# Patient Record
Sex: Female | Born: 1935 | Race: White | Hispanic: No | Marital: Single | Smoking: Never smoker
Health system: Southern US, Community
[De-identification: ages and names within clinical notes are randomized; demographics above are authoritative.]

## PROBLEM LIST (undated history)

## (undated) DIAGNOSIS — R4701 Aphasia: Secondary | ICD-10-CM

## (undated) DIAGNOSIS — F32A Depression, unspecified: Secondary | ICD-10-CM

## (undated) DIAGNOSIS — H409 Unspecified glaucoma: Secondary | ICD-10-CM

## (undated) DIAGNOSIS — I639 Cerebral infarction, unspecified: Secondary | ICD-10-CM

## (undated) DIAGNOSIS — M21371 Foot drop, right foot: Secondary | ICD-10-CM

## (undated) DIAGNOSIS — R1310 Dysphagia, unspecified: Secondary | ICD-10-CM

## (undated) DIAGNOSIS — F329 Major depressive disorder, single episode, unspecified: Secondary | ICD-10-CM

## (undated) DIAGNOSIS — I4891 Unspecified atrial fibrillation: Secondary | ICD-10-CM

## (undated) DIAGNOSIS — R29810 Facial weakness: Secondary | ICD-10-CM

## (undated) DIAGNOSIS — I10 Essential (primary) hypertension: Secondary | ICD-10-CM

---

## 2005-11-09 ENCOUNTER — Inpatient Hospital Stay (HOSPITAL_COMMUNITY): Admission: EM | Admit: 2005-11-09 | Discharge: 2005-11-19 | Payer: Self-pay | Admitting: Emergency Medicine

## 2005-11-09 ENCOUNTER — Ambulatory Visit: Payer: Self-pay | Admitting: Internal Medicine

## 2005-11-09 ENCOUNTER — Ambulatory Visit: Payer: Self-pay | Admitting: Hospitalist

## 2005-11-10 ENCOUNTER — Encounter: Payer: Self-pay | Admitting: Internal Medicine

## 2005-11-10 ENCOUNTER — Encounter: Payer: Self-pay | Admitting: Vascular Surgery

## 2005-11-16 ENCOUNTER — Ambulatory Visit: Payer: Self-pay | Admitting: Physical Medicine & Rehabilitation

## 2005-11-18 ENCOUNTER — Ambulatory Visit: Payer: Self-pay | Admitting: Internal Medicine

## 2006-01-18 ENCOUNTER — Ambulatory Visit (HOSPITAL_COMMUNITY): Admission: RE | Admit: 2006-01-18 | Discharge: 2006-01-18 | Payer: Self-pay | Admitting: Endocrinology

## 2006-02-18 ENCOUNTER — Ambulatory Visit (HOSPITAL_COMMUNITY): Admission: RE | Admit: 2006-02-18 | Discharge: 2006-02-18 | Payer: Self-pay | Admitting: Endocrinology

## 2007-05-12 ENCOUNTER — Emergency Department (HOSPITAL_COMMUNITY): Admission: EM | Admit: 2007-05-12 | Discharge: 2007-05-12 | Payer: Self-pay | Admitting: Emergency Medicine

## 2010-11-20 NOTE — Consult Note (Signed)
NAMETHEREASA, IANNELLO                 ACCOUNT NO.:  [id number]   MEDICAL RECORD NO.:  [id number]          PATIENT TYPE:  INP   LOCATION:  3105                         FACILITY:  [facility]   PHYSICIAN:  Bevelyn Buckles. [place]am[place]y, M.D.DATE OF BIRTH:  1916/07/29   DATE OF CONSULTATION:  11/10/2005  DATE OF DISCHARGE:                                   CONSULTATION   REFERRING PHYSICIAN:  Eliseo Gum, M.D.   REASON FOR CONSULTATION:  Stroke.   HISTORY OF PRESENT ILLNESS:  Ms. Vesey is an 77 year old Caucasian female  with multiple medical problems who presented yesterday with symptoms of  right-sided weakness and aphasia.  The patient was found on the floor at  home by her daughter who noted right-sided weakness and the patient could  not speak.  The patient could say yes or no, however, could not express or  talk any further.  No acute intervention was done secondary the patient was  at a window for IV tPA.  The patient was still aphasic.  MRA showed a left  basal ganglion infarct with hemorrhagic conversion.  The patient still is  aphasic and unable to communicate, so no further history is possible as per  patient.  Chart is reviewed.   PAST MEDICAL HISTORY:  1.  Positive for hypertension.  2.  Hyperlipidemia.  3.  Osteoporosis.  4.  Glaucoma.  5.  Depression.   CURRENT MEDICATIONS:  Lipitor and Protonix.   ALLERGIES:  The patient has no known drug allergies.   FAMILY HISTORY:  Positive for stroke.   SOCIAL HISTORY:  The patient is divorced.  Lives with her son in  [place].  Former smoker.  Does not drink alcohol.   REVIEW OF SYSTEMS:  Unable to assess at this time secondary to aphasia.   PHYSICAL EXAMINATION:  VITAL SIGNS:  Temperature is 98.5, blood pressure is  150/55, pulse is 85, respirations are 20.  O2 saturation is 98% on two  liters.  HEENT:  Normocephalic and atraumatic.  The patient does have a right facial  droop.  Extraocular muscles are intact.  Pupils are equal,  round, and  reactive to light.  The patient is currently in a neck collar.  HEART:  Regular.  LUNGS:  Clear.  ABDOMEN:  Soft and nontender.  EXTREMITIES:  Good pulses.  NEUROLOGICAL:  The patient is awake.  Follows some simple commands.  Cranial  nerves:  The patient has a right facial droop.  Extraocular muscles are  intact.  Pupils are equal, round, and reactive to light.  Tongue is midline.  Motor examination:  The patient has right hemiparesis, worse in the upper  extremities than lower extremities.  The patient has 0 to 1/5 on the right  upper extremity and 2 to 3/5 in the right lower extremity.  The patient has  decreased tone in the right upper extremity and right pronator drift all the  way to bed.  Sensory examination:  The patient has decreased withdrawal on  the right when compared to the left.  Reflexes are trace throughout.  Right  toes upgoing.  Left toes are downgoing.  Cerebellar function and gait are  unable to assess at this time.   MRI of the brain show left basal ganglion, hemorrhagic infarct with mass-  effect on the lateral ventricle.  2-D echocardiogram showed an EF of 60-65%.   LABS:  WBC is 5.8, hemoglobin is 12.7, hematocrit is 37.4, platelets  173,000.  PT is 13.1, INR is 1.0, PTT is 26.  Sodium is 138, potassium is  3.2, chloride is 107, CO2 is 26, BUN 5, creatinine 0.6, glucose 141, calcium  is 8.3, cholesterol is 157, LDL is 82, HDL is 64.   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  The patient's blood pressure needs to be optimally control  and I agree withholding aspirin and Aggrenox.  I will continue the stroke  workup and I would add a homocystine level at this time.  If the homocystine  level is elevated, I would consider starting Foltx and Metanx 1 tablet  daily. I would repeat her CT scan in the morning to see if there is any   progression of the bleed.  The patient needs to continue having frequent  neuro checks and monitoring closely.  Get PT and OT consults and have a  cervical spine evaluated as MRA of the brain showed a questionable edema  around the dens.  I will keep the patient n.p.o. until she clears swallowing  evaluation.  We will follow the patient on the stroke consult service.      Bevelyn Buckles. Nash Shearer, M.D.  Electronically Signed     DRC/MEDQ  D:  11/10/2005  T:  11/12/2005  Job:  [id number]

## 2010-11-20 NOTE — Consult Note (Signed)
Amber Zamora, Amber Zamora                 ACCOUNT NO.:  [id number]   MEDICAL RECORD NO.:  [id number]          PATIENT TYPE:  INP   LOCATION:  3011                         FACILITY:  [facility]   PHYSICIAN:  Iva Boop, M.D. LHCDATE OF BIRTH:  14-Dec-1916   DATE OF CONSULTATION:  11/15/2005  DATE OF DISCHARGE:                                   CONSULTATION   REQUESTING PHYSICIAN:  Dr. Eliseo Gum.   REASON FOR CONSULTATION:  Dysphagia after a stroke.   ASSESSMENT:  Neurogenic dysphagia.  The patient has had a large MCA stroke  on the left and has a oropharyngeal dysphagia related to that.  She has  failed two modified barium swallows and aspirated.   PLAN:  She is an appropriate candidate for percutaneous endoscopic  gastrostomy tube.  We will arrange to do this tomorrow.  We have spoken to  the family and explained this, and they agreed to proceed.   HISTORY:  An 77 year old woman admitted with new-onset weakness turned out  to have a large left middle cerebral artery distribution stroke, thought to  be hemorrhagic.  She has aphasia and a right hemiparesis as well as  oropharyngeal dysphagia documented on two swallowing studies.  She really  cannot provide any history.  She has a nasoenteric tube in with Jevity  infusing at 45 mL/hour at this time.  The plans are for her to go to an  extended care facility for whatever rehab she can tolerate.   MEDICATIONS:  1.  Protonix 40 mg daily.  2.  Chlorhexidine rinse.  3.  Jevity supplements.  4.  Diltiazem infusion has been used at times.   No known drug allergies.   PAST MEDICAL HISTORY:  1.  Obesity.  2.  Hypertension.  3.  Dyslipidemia.  4.  Osteoporosis.  5.  Glaucoma.  6.  Possible depression.  7.  Breast cyst, removed.  8.  Cataract removal.   SOCIAL HISTORY:  She lives with her son in [place].  She had been  visiting here.   FAMILY HISTORY:  Stroke in parents.   REVIEW OF SYSTEMS:  She had been active and healthy  prior to her CVA and had  been able to drive from [place] to this area.  Difficult to obtain a  review of systems further given her situation.   PHYSICAL EXAMINATION:  GENERAL APPEARANCE:  Physical exam reveals an obese,  elderly white woman with a nasoenteric tube entering the nose.  VITAL SIGNS:  Temperature 98.2, blood pressure 157/71, pulse 84,  respirations 20.  EYES:  Anicteric.  ENT:  Tongue:  Midline.  Mouth:  Slight coat on the tongue.  NECK:  No mass.  CHEST:  Clear with some rhonchi otherwise in the anterior portion.  HEART:  S1, S2 regular.  No murmurs, rubs or gallops.  Regular rhythm.  ABDOMEN:  Soft, obese and nontender.  No organomegaly or mass.  No scars.  Bowel sounds present.  EXTREMITIES:  No edema in the lower extremities.  Feet are warm and dry.  NEURO:  She has a left hemiplegia.  She follows commands and attempts to  answer questions but really grunts and nods.  LYMPH NODES:  No neck or supraclavicular nodes palpated.  SKIN:  Has some ecchymoses from needle trauma, etc., but otherwise free of  lesions.   LABORATORY:  INR 1.0, PTT 27.  Platelet count 153, hemoglobin 13.  BMET  normal except for a glucose of 153.  LFTs normal.   I appreciate the opportunity to care for this patient.   NOTE:  I have reviewed her portable abdominal film and her CT head, MRI and  C-spine reports as well.  The C-spine films suggest the possibility of  inflammatory arthritis.      Iva Boop, M.D. [facility]  Electronically Signed     CEG/MEDQ  D:  11/15/2005  T:  11/16/2005  Job:  [id number]   cc:   Eliseo Gum, M.D.  Fax: [phone]

## 2010-11-20 NOTE — Discharge Summary (Signed)
Amber Zamora, Amber Zamora                 ACCOUNT NO.:  [id number]   MEDICAL RECORD NO.:  [id number]          PATIENT TYPE:  INP   LOCATION:  3011                         FACILITY:  [facility]   PHYSICIAN:  Eliseo Gum, M.D.   DATE OF BIRTH:  1917/02/21 (77)   DATE OF ADMISSION:  11/09/2005  DATE OF DISCHARGE:  11/19/2005                                 DISCHARGE SUMMARY   DISCHARGE DIAGNOSES:  1.  Left middle cerebral artery stroke, ischemic with hemorrhagic      conversion.  2.  Swallowing difficulties, status post percutaneous endoscopic gastrostomy      placement for tube feeds.  3.  Hypertension.  4.  Hyperglycemia with a hemoglobin A1C of 5.9% and sugars in the low 100s.  5.  Depression.  6.  Glaucoma.  7.  Coronary artery disease with a history of an left anterior descending      artery stent.  8.  One episode of atrial fibrillation, which did not recur.  9.  Hyperlipidemia.  10. History of cataract removal.  11. History of breast cyst removed.  12. Osteoporosis.   DISCHARGE MEDICATIONS:  1.  Zoloft 50 mg per tube q.h.s.  2.  Simvastatin 40 mg per tube daily.  3.  Calcium carbonate 500 mg t.i.d. per tube.  4.  Dorzolamide 1 drop t.i.d. in the right eye.  5.  Lantaprost 0.005% 1 drop in the right eye q.h.s.  6.  Norvasc 5 mg per tube daily.  7.  Jevity 1.2 calorie nutritional supplement 280 ml 4 times a day.  8.  Free water, flush 50 ml before and 50 ml after each tube feeding.  9.  Aggrenox 25/200 mg ER 1 caplet per PEG tube b.i.d.  Please give Tylenol      325 mg per PEG tube 30 minutes before each Aggrenox dose for the first      week of Aggrenox administration to pre-treat for potential headache.  To      be started Nov 26, 2005.   CONDITION ON DISCHARGE:  Amber Zamora was much improved.  She will need to  continue working closely with physical, occupational, and speech therapy.  She is still dysarthric and has very limited use of her right arm but has  recovered much of the  use of her right leg.  Her facial droop on the right  has improved somewhat but is still marked.  She is to follow up with Dr.  Pearlean Brownie in neurology in 2-3 months from discharge.  She will be going to a  nursing home for continued intensive therapy.   PROCEDURE:  1.  A CT of the head without contrast on Nov 09, 2005 showed findings      compatible with an acute left MCA distribution infarction, most evident      as edema involving the left basal ganglia structures, suspicious for      left MCA clot.  2.  CT of the spine without contrast medium was done, which showed advanced      multilevel degenerative spondylosis, advanced degenerative disk disease  at C6-7, marked facet arthropathic changes, particularly on the right      minimal anterior subluxation of C4 on C5, which may be degenerative in      nature.  Then on the 9th, an MRI of the head and spine was performed      which showed a large left basal ganglia, a hemorrhagic infarct with      associated mass effect. There is also a smaller infarct posterior to      this region in the parietal lobe.  Question fracture of the dens.  MRI      of the cervical spine with STIR imaging is recommended.  Atrophy and      significant small vessel disease type changes.  It also showed occluded      left middle cerebral artery just beyond takeoff of the anterior branch      with minimal reconstitution of surrounding vessels.  It is possible that      this is embolic in origin.  The MRI of the cervical spine without      contrast showed abnormal marrow signal and edema within the dens.  There      is no definite displacement.  The differential diagnosis includes      nondisplaced fracture versus chronic inflammatory change.  Dissociated      soft tissues suggest the possibility of inflammatory arthritis, favoring      the latter.  CT may be of use for further evaluation and probable      fracture.  3.  Multilevel spondylosis with facet degenerative  changes and      anterolisthesis at each level from C4-5 through C7-T1.  Three end-plate      degenerative changes are most pronounced at C6-7 and C7-T1 and      posteriorly at C4-5.  On Nov 11, 2005, a CT of the head and spine      without contrast medium was done, which showed extensive degenerative      cervical spondylotic changes without definite fracture of the odontoid      process, favoring inflammatory arthropathic changes noted at C1-2, as      may be seen with rheumatoid arthritis or rheumatoid arthritis-like      process.  The CT of the head at the same time showed evolution of left      MCA infarction.  There is increase in the edema and local mass effect.      Evidence of hemorrhage probably in the form of petechial hemorrhage.  No      frank hematoma.  An abdominal x-ray on the same day was done for feeding      tube placement, and that was repeated several times until feeding tube      is appropriately located transpylorically.  4.  On the 11th, a CT of the head was repeated, which showed increase in      hemorrhage in the patient's left MCA infarct with mass effect on the      left lateral ventricle, approximately 0.4 cm of left to right midline      shift, which is new.  No hydrocephalus or other change.  5.  On Nov 12, 2005, a chest x-ray showed cardiomegaly without pulmonary      edema.  6.  On Nov 14, 2005, a CT of the head without contrast was repeated, which      showed hemorrhagic infarct of the left basal ganglia with slight  increase in the amount of high attenuation blood since the previous      study.  The degree of mass effect midline shift, however, not      significantly changed from the previous exam.  Underlying small vessel      chronic ischemic changes of deep cerebral and white matter.  7.  A swallowing test was performed on Nov 15, 2005, in which the patient      was shown to be severely deficient in swallowing. 8.  A CT of the head without  contrast medium was repeated on May 14th,      showing slight improvement in left basal ganglia, hypertensive      hemorrhage, and surrounding edema.  9.  Then on May 15th, 2007, a percutaneous gastrostomy tube was placed, and      the patient tolerated this procedure well.  10. A 2D echocardiogram was done on Nov 10, 2005.  It showed overall left      ventricular systolic function was normal.  Left ventricular ejection      fraction was estimated at 60-65%.  There were no left ventricular      regional wall motion abnormalities.  The left ventricular wall thickness      was mild-to-moderately increased.  There was mild aortic valvular      regurgitation.  There was mild-to-moderate thickening of the mitral      valve.  There was lower normal mitral valve leaflet excursion.  The      findings were consistent with very mild mitral stenosis.   CONSULTATIONS:  Gastroenterology was consulted.  Dr. Stan Head performed  the PEG tube placement.   HISTORY AND PHYSICAL:  For full H&P, please consult the chart, but in brief,  Ms. Amber Zamora is an 77 year old Caucasian woman who lives in [place] with  her bipolar son with a past medical history of hypertension, hyperlipidemia,  coronary artery disease, osteoporosis, and depression, who was driving down  by herself from [place] to [place], and then from [place] to [place], who arrived in [place] at her daughter's house much later  than expected.  Her daughter thought this was strange, but her mother  offered no explanation, and she is a very private person, so she did not ask  any further.  At 6:30 in the morning the next day, her daughter found her  sleeping peacefully, so she did not wake her.  Later in the day, her mother  did not respond to any phone calls or answer the door when some workers came  by, so the daughter came home and found her mother, Ms. Point, on the floor  with a facial droop and no use of her right  side.  This was at 1:45 in the  afternoon.  She called 911.  She felt that the episode probably occurred  between 6:30 and 8:00 in the morning because her mother was dressed but had  not yet been to the bathroom and that was her normal waking time.  Ms.  Nouri was responsive in trying to get up but was not able to speak.  She  was able to do yes or no but could not talk.   No known drug allergies.   Medications were unclear at the time of admission because her records were  not available, and we were not sure what she was taking.   She is a former smoker.  No alcohol.  No drugs.  Divorced.  Living with  her  son in [place].   Her mother, father, and both brothers all died of strokes.  She has a  healthy son and daughter.  Her daughter's name is Fusako Tanabe, and she is  very nice.   REVIEW OF SYSTEMS:  Only per the daughter, and she only notices some  weakness and depression but had not heard her mother complain of anything  else.  PHYSICAL EXAMINATION:  VITAL SIGNS:  Temperature 97.5, blood pressure  144/75, pulse 93, respirations 18, O2 sat 96% on room air.  GENERAL:  She was aphasic, awakened, but slightly confused.  She has gaze  preference for the left and straight and would not look right.  She had  conjunctival and scleral injection.  Her mucous membranes were dry.  LUNGS:  Faint crackles diffusely.  HEART:  Regular rate and rhythm.  No murmurs, rubs or gallops.  ABDOMEN:  Soft and nontender.  Nondistended.  No guarding.  SKIN:  Warm and dry.  EXTREMITIES:  Her right arm strength was 0-1/5.  She was really not able to  move it at all.  Her right lower extremity was 3/5.  Her left upper and  lower extremities were +4/5 strength.  She had a right facial droop with  slurring and had unintelligible speech and could follow only one-step  commands but did so inconsistently.  Her tongue appeared to be midline.  Deep tendon reflexes in the upper and lower extremities were  2-3+  bilaterally.  She had a negative Babinski's bilaterally.   LABS:  Sodium 142, potassium 3.7, chloride 111, bicarb 23, BUN 10,  creatinine 0.7, glucose 113, bilirubin 0.9, alk phos 56, AST 26, ALT 22,  protein 6, albumin 3.7, calcium 8.7.  PT 13.1, INR 1, and PTT 26.  White  count 8 with an ANC of 7.2, hemoglobin 13.3 with hematocrit of 38.8 and an  MCV of 93.3.  Platelets 162.  A UA showed small hemoglobin, ketones, 40  leukocyte esterase, nitrite negative, 3-6 white blood cells.   The CT of the head without contrast showed the left MCA stroke.   HOSPITAL COURSE:  For her left MCA stroke, we admitted her to the ICU to  follow her neurologic status closely.  We kept her n.p.o. until she could  have a swallowing evaluation, which she failed dramatically.  For that  purpose, we placed a transpyloric feeding tube and fed her Jevity tube feeds  through that, which she tolerated well.  We did the full workup.  Carotid  Dopplers were negative.  The 2D echo was largely normal.   We followed her infarct with serial CT scans and also ordered an MRI/MRA of  the head to better characterize the infarct.  We watched for signs of edema  and possible herniation and kept the head of the bed equal to 45 degrees to  decrease intracranial pressure.  Although she did experience some swelling  and a very mild midline shift, the size of her ventricles was sufficient to  accommodate this expansion, and she was not more symptomatic from it.  In  fact, on the day of the midline shift, her neurologic exam improved  dramatically.  We held off on Aggrenox and aspirin because she was found to  have a hemorrhage conversion of her infarct, and this medication, the  Aggrenox, is to be started a week after the end of the evolution of the  stroke, which is on the 15th.  We held her Lipitor until a  week or so after  the primary event and checked a fasting lipid panel, and her cholesterol was all normal.  We had  physical, occupational, and speech therapy working  closely with her.  Also, I should mention that lower extremity Dopplers were  negative, which we did to rule out embolic source.  Since they were  negative, we did not pursue a bubble study to evaluate for PFO.  While she  was here, her neurologic exam improved pretty steadily on a daily basis.  She regained more function of her right lower extremity, to the point on  discharge to being almost completely normal.  Her right upper extremity did  regain some function as well, being at best 3/5 strength, where she was able  to move it in the lateral plane but not lift her arm.  Her facial droop on  the right also improved.  Her left upper and lower extremities remained  functional during her hospitalization.  Because of her significant  dysphagia, we came to the conclusion that she would need a feeding tube,  which she agreed to; therefore, a PEG tube was placed for feeding.  She  should continue working with the speech therapist to recuperate swallowing  function so she can discontinue the feeding tube as soon as she gets strong.  I discussed her prognosis with her daughter, underlying the slow nature of  her recovery.  She understands that it may take up to months for her to  recover some dysfunction.  Ms. Trzcinski is a very strong-willed, dedicated  patient.  We feel confident that she will work with the therapist and  participate fully in her rehabilitation.  For this reason and her high level  of functioning prior to the stroke, we have high hopes for her recuperation.   For her hypertension, we held her blood pressure meds around the acute phase  of the stroke but once she was out of the acute phase, we started her back  on her Norvasc 5 mg per PEG tube, and she tolerated that fine.   Hyperlipidemia:  Like I said, we held her Zocor until out of the acute  phase, but then we restarted it per PEG tube as well.  Her cholesterol was  157,  LDL 82, HDL 64, and triglycerides 56.  Also, her cardiac enzymes and  EKG were unchanged with the exception of her one episode of atrial  fibrillation.   Atrial fibrillation:  She did go into A fib once on the night of May 11 to  Nov 13, 2005.  She was started on a Cardizem drip and given labetalol for  her A fib and tachycardia.  At this time, she was in the ICU.  She went back  into sinus rhythm immediately, and the Cardizem was actually discontinued  secondary to bradycardia into the mid 50s.  She remained in sinus rhythm  throughout the rest of her hospitalization.  We felt that her atrial  fibrillation might have been provoked by the stroke; however, it is possible  that A fib was the cause of her stroke; however, we never saw any other  activity of this sort while she was here.  As an outpatient, it would be  prudent for her to undergo a thorough cardiac workup to evaluate this  possibility.  For her osteoporosis, as soon as she got the PEG tube, we restarted calcium.   For her hyperglycemia, I did check a hemoglobin A1C, and it was 5.9%, so  it  is likely that she was just having mild hyperglycemic episodes, really in  the low to mid 100s, which probably constitutes pre-diabetes.   For her coronary artery disease, we checked a TSH, which was normal, and  like I said, the cardiac enzymes and EKGs were normal without exception of  the one episode of A fib.   For her glaucoma, she was started back on her home medicines, which were  listed in the medicine list.  For her depression, she was started back on  her home antidepressant of Zoloft per PEG tube.   Finally, for nutrition, she was eventually given nutrition through the PEG  tube and four microboluses, as outlined in the medication list, with free  water to meet her needs.  This should be monitored along with basic  metabolic panels to make sure that her electrolytes are balanced.  There are  no pending labs at the time of  discharge.  On the day of discharge, her  white blood cell count is 7.2, hemoglobin 12.8, hematocrit 37.5, platelet  count 181.  Sodium 141, potassium 3.9, chloride 104, bicarb 29, glucose 125,  BUN 10, creatinine 0.7, calcium 8.9.  The day prior to admission, liver  function tests were procured.  Her total bilirubin was 0.6, alk phos 77, AST  49, ALT 43, total protein 5.3, albumin 2.4, and calcium 8.6.   Her vital signs were stable.   She will need to follow up with Dr. Pearlean Brownie in 2-3 months after this  hospitalization.      Clearance Coots, M.D.      Eliseo Gum, M.D.  Electronically Signed    IN/MEDQ  D:  11/19/2005  T:  11/19/2005  Job:  [id number]

## 2011-04-13 LAB — I-STAT 8, (EC8 V) (CONVERTED LAB)
BUN: 17
Chloride: 106
Glucose, Bld: 103 — ABNORMAL HIGH
Hemoglobin: 14.3
Operator id: 270111
Sodium: 140

## 2011-04-13 LAB — CBC
HCT: 40.4
MCHC: 33.9
RDW: 12.9

## 2011-04-13 LAB — URINALYSIS, ROUTINE W REFLEX MICROSCOPIC
Bilirubin Urine: NEGATIVE
Nitrite: POSITIVE — AB
Specific Gravity, Urine: 1.019
Urobilinogen, UA: 1
pH: 7

## 2011-04-13 LAB — URINE MICROSCOPIC-ADD ON

## 2011-04-13 LAB — DIFFERENTIAL
Basophils Absolute: 0
Lymphocytes Relative: 25
Neutro Abs: 2.8

## 2011-04-13 LAB — POCT I-STAT CREATININE: Creatinine, Ser: 0.9

## 2013-01-24 ENCOUNTER — Encounter (HOSPITAL_COMMUNITY): Payer: Self-pay | Admitting: Emergency Medicine

## 2013-01-24 ENCOUNTER — Emergency Department (HOSPITAL_COMMUNITY): Payer: Medicare Other

## 2013-01-24 ENCOUNTER — Inpatient Hospital Stay (HOSPITAL_COMMUNITY)
Admission: EM | Admit: 2013-01-24 | Discharge: 2013-01-27 | DRG: 064 | Disposition: A | Discharge status: Skilled Nursing Facility | Payer: Medicare Other | Attending: Endocrinology | Admitting: Endocrinology

## 2013-01-24 DIAGNOSIS — I69920 Aphasia following unspecified cerebrovascular disease: Secondary | ICD-10-CM

## 2013-01-24 DIAGNOSIS — H44819 Hemophthalmos, unspecified eye: Secondary | ICD-10-CM | POA: Diagnosis present

## 2013-01-24 DIAGNOSIS — I4891 Unspecified atrial fibrillation: Secondary | ICD-10-CM

## 2013-01-24 DIAGNOSIS — Z66 Do not resuscitate: Secondary | ICD-10-CM | POA: Diagnosis present

## 2013-01-24 DIAGNOSIS — Z79899 Other long term (current) drug therapy: Secondary | ICD-10-CM

## 2013-01-24 DIAGNOSIS — Y9210 Unspecified residential institution as the place of occurrence of the external cause: Secondary | ICD-10-CM | POA: Diagnosis present

## 2013-01-24 DIAGNOSIS — E039 Hypothyroidism, unspecified: Secondary | ICD-10-CM | POA: Diagnosis present

## 2013-01-24 DIAGNOSIS — Z993 Dependence on wheelchair: Secondary | ICD-10-CM

## 2013-01-24 DIAGNOSIS — I2510 Atherosclerotic heart disease of native coronary artery without angina pectoris: Secondary | ICD-10-CM | POA: Diagnosis present

## 2013-01-24 DIAGNOSIS — Z515 Encounter for palliative care: Secondary | ICD-10-CM

## 2013-01-24 DIAGNOSIS — I69998 Other sequelae following unspecified cerebrovascular disease: Secondary | ICD-10-CM

## 2013-01-24 DIAGNOSIS — F3289 Other specified depressive episodes: Secondary | ICD-10-CM | POA: Diagnosis present

## 2013-01-24 DIAGNOSIS — S022XXA Fracture of nasal bones, initial encounter for closed fracture: Secondary | ICD-10-CM | POA: Diagnosis present

## 2013-01-24 DIAGNOSIS — H409 Unspecified glaucoma: Secondary | ICD-10-CM | POA: Diagnosis present

## 2013-01-24 DIAGNOSIS — F329 Major depressive disorder, single episode, unspecified: Secondary | ICD-10-CM | POA: Diagnosis present

## 2013-01-24 DIAGNOSIS — I10 Essential (primary) hypertension: Secondary | ICD-10-CM | POA: Diagnosis present

## 2013-01-24 DIAGNOSIS — H10409 Unspecified chronic conjunctivitis, unspecified eye: Secondary | ICD-10-CM | POA: Diagnosis present

## 2013-01-24 DIAGNOSIS — W050XXA Fall from non-moving wheelchair, initial encounter: Secondary | ICD-10-CM | POA: Diagnosis present

## 2013-01-24 DIAGNOSIS — I629 Nontraumatic intracranial hemorrhage, unspecified: Principal | ICD-10-CM | POA: Diagnosis present

## 2013-01-24 DIAGNOSIS — S0120XA Unspecified open wound of nose, initial encounter: Secondary | ICD-10-CM | POA: Diagnosis present

## 2013-01-24 DIAGNOSIS — J189 Pneumonia, unspecified organism: Secondary | ICD-10-CM

## 2013-01-24 DIAGNOSIS — R29898 Other symptoms and signs involving the musculoskeletal system: Secondary | ICD-10-CM | POA: Diagnosis present

## 2013-01-24 DIAGNOSIS — N390 Urinary tract infection, site not specified: Secondary | ICD-10-CM | POA: Diagnosis present

## 2013-01-24 HISTORY — DX: Major depressive disorder, single episode, unspecified: F32.9

## 2013-01-24 HISTORY — DX: Facial weakness: R29.810

## 2013-01-24 HISTORY — DX: Aphasia: R47.01

## 2013-01-24 HISTORY — DX: Essential (primary) hypertension: I10

## 2013-01-24 HISTORY — DX: Depression, unspecified: F32.A

## 2013-01-24 HISTORY — DX: Dysphagia, unspecified: R13.10

## 2013-01-24 HISTORY — DX: Unspecified glaucoma: H40.9

## 2013-01-24 HISTORY — DX: Foot drop, right foot: M21.371

## 2013-01-24 HISTORY — DX: Cerebral infarction, unspecified: I63.9

## 2013-01-24 LAB — COMPREHENSIVE METABOLIC PANEL
ALT: 12 U/L (ref 0–35)
Albumin: 3.2 g/dL — ABNORMAL LOW (ref 3.5–5.2)
BUN: 17 mg/dL (ref 6–23)
Calcium: 9.6 mg/dL (ref 8.4–10.5)
GFR calc Af Amer: 47 mL/min — ABNORMAL LOW (ref >90)
Glucose, Bld: 133 mg/dL — ABNORMAL HIGH (ref 70–99)
Sodium: 142 mEq/L (ref 135–145)
Total Protein: 6.6 g/dL (ref 6.0–8.3)

## 2013-01-24 LAB — CBC WITH DIFFERENTIAL/PLATELET
Basophils Relative: 0 % (ref 0–1)
Eosinophils Absolute: 0.1 10*3/uL (ref 0.0–0.7)
Eosinophils Relative: 1 % (ref 0–5)
Lymphs Abs: 1.7 10*3/uL (ref 0.7–4.0)
MCH: 28 pg (ref 26.0–34.0)
MCHC: 33.3 g/dL (ref 30.0–36.0)
MCV: 83.9 fL (ref 78.0–100.0)
Platelets: 196 10*3/uL (ref 150–400)
RBC: 4.4 MIL/uL (ref 3.87–5.11)

## 2013-01-24 LAB — POCT I-STAT, CHEM 8
HCT: 39 % (ref 36.0–46.0)
Hemoglobin: 13.3 g/dL (ref 12.0–15.0)
Sodium: 143 mEq/L (ref 135–145)
TCO2: 25 mmol/L (ref 0–100)

## 2013-01-24 LAB — CG4 I-STAT (LACTIC ACID): Lactic Acid, Venous: 1.18 mmol/L (ref 0.5–2.2)

## 2013-01-24 LAB — TROPONIN I: Troponin I: <0.3 ng/mL (ref <0.30)

## 2013-01-24 MED ORDER — SODIUM CHLORIDE 0.9 % IV SOLN
15.0000 mg/kg | Freq: Once | INTRAVENOUS | Status: DC
  Filled 2013-01-24: qty 1089

## 2013-01-24 MED ORDER — PIPERACILLIN-TAZOBACTAM 3.375 G IVPB 30 MIN
3.3750 g | Freq: Once | INTRAVENOUS | Status: AC
  Administered 2013-01-24: 3.375 g via INTRAVENOUS
  Filled 2013-01-24: qty 50

## 2013-01-24 MED ORDER — VANCOMYCIN HCL IN DEXTROSE 1-5 GM/200ML-% IV SOLN
1000.0000 mg | Freq: Once | INTRAVENOUS | Status: AC
  Administered 2013-01-24: 1000 mg via INTRAVENOUS
  Filled 2013-01-24: qty 200

## 2013-01-24 MED ORDER — DILTIAZEM HCL 100 MG IV SOLR: 5.0000 mg/h | INTRAVENOUS | Status: DC

## 2013-01-24 MED ORDER — DILTIAZEM LOAD VIA INFUSION
10.0000 mg | Freq: Once | INTRAVENOUS | Status: AC
  Administered 2013-01-24: 10 mg via INTRAVENOUS
  Filled 2013-01-24: qty 10

## 2013-01-24 MED ORDER — DILTIAZEM HCL 100 MG IV SOLR
5.0000 mg/h | INTRAVENOUS | Status: DC
  Administered 2013-01-24: 5 mg/h via INTRAVENOUS

## 2013-01-24 MED ORDER — SODIUM CHLORIDE 0.9 % IV SOLN
Freq: Once | INTRAVENOUS | Status: AC
  Administered 2013-01-24: 22:00:00 via INTRAVENOUS

## 2013-01-24 NOTE — ED Notes (Signed)
Radiology at bedside for PCXR

## 2013-01-24 NOTE — ED Provider Notes (Signed)
History    CSN: [id number] Arrival date & time 01/24/13  4098  First MD Initiated Contact with Patient 01/24/13 1909     Chief Complaint  Patient presents with  . Fall  . 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 Per daughter pt in normal state of health this am. Larey Seat from wheelchair striking right side against ground and sustained a small laceration to nasal bridge (was wearing glasses). unknown LOC, no change from pts baseline mental status. EMS called and noted AF w/ RVR, hemodynamics stable en route. Rate 80-180, on arrival GCS 11 (. Normotensive, pulse 160s, no resp distress or hypoxia. Pt non verbal at baseline.   Patient is a 79 y.o. female presenting with fall and atrial fibrillation.  Fall This is a new problem. The current episode started today. The problem occurs constantly. The problem has been unchanged. She has tried nothing for the symptoms. The treatment provided no relief.  Atrial Fibrillation This is a new problem. The current episode started today. The problem occurs constantly. The problem has been unchanged. She has tried nothing for the symptoms. The treatment provided no relief.   Past Medical History  Diagnosis Date  . Aphasia   . Dysphagia   . Facial weakness   . Stroke   . Depression   . Hypertension   . Glaucoma    History reviewed. No pertinent past surgical history. No family history on file. History  Substance Use Topics  . Smoking status: Never Smoker   . Smokeless tobacco: Not on file  . Alcohol Use: No   OB History   Grav Para Term Preterm Abortions TAB SAB Ect Mult Living                 Review of Systems  Unable to perform ROS All other systems reviewed and are negative.    Allergies  Review of patient's allergies indicates no known allergies.  Home  Medications   Current Outpatient Rx  Name  Route  Sig  Dispense  Refill  . albuterol (PROVENTIL) (2.5 MG/3ML) 0.083% nebulizer solution   Nebulization   Take 2.5 mg by nebulization every 6 (six) hours as needed for wheezing.         Marland Kitchen amLODipine (NORVASC) 5 MG tablet   Oral   Take 5 mg by mouth daily.         Marland Kitchen atorvastatin (LIPITOR) 20 MG tablet   Oral   Take 20 mg by mouth daily.         . calcium-vitamin D (OSCAL WITH D) 500-200 MG-UNIT per tablet   Oral   Take 1 tablet by mouth 3 (three) times daily.         Marland Kitchen dipyridamole-aspirin (AGGRENOX) 200-25 MG per 12 hr capsule   Oral   Take 1 capsule by mouth 2 (two) times daily.         . fluticasone (FLONASE) 50 MCG/ACT nasal spray   Nasal   Place 2 sprays into the nose daily.         Marland Kitchen levothyroxine (SYNTHROID, LEVOTHROID) 50 MCG tablet   Oral   Take 50 mcg by mouth daily before breakfast.         . loratadine (CLARITIN) 10 MG tablet   Oral   Take 10 mg by mouth daily.         Marland Kitchen  Polyethyl Glycol-Propyl Glycol (SYSTANE PRESERVATIVE FREE) 0.4-0.3 % SOLN   Both Eyes   Place 1 drop into both eyes 2 (two) times daily.         . sertraline (ZOLOFT) 50 MG tablet   Oral   Take 50 mg by mouth daily.         . Travoprost, BAK Free, (TRAVATAN) 0.004 % SOLN ophthalmic solution   Both Eyes   Place 1 drop into both eyes at bedtime.         . Vitamin D, Ergocalciferol, (DRISDOL) 50000 UNITS CAPS   Oral   Take 50,000 Units by mouth every 7 (seven) days.          BP 106/56  Pulse 74  Temp(Src) 98.3 F (36.8 C) (Oral)  Resp 20  SpO2 97% Physical Exam  Vitals reviewed. Constitutional: She is oriented to person, place, and time. She appears well-developed and well-nourished. She appears listless. She appears ill. No distress.  HENT:  Head: Normocephalic and atraumatic.  Nose: Nose lacerations and sinus tenderness present. No septal deviation or nasal septal hematoma. No epistaxis.    Eyes: EOM are  normal. Pupils are equal, round, and reactive to light.    Neck: Normal range of motion. Neck supple.  Cardiovascular: An irregularly irregular rhythm present. Tachycardia present.   Pulses:      Radial pulses are 2+ on the right side, and 2+ on the left side.  Pulmonary/Chest: Effort normal and breath sounds normal. No respiratory distress.  Abdominal: Soft. She exhibits no distension. There is no tenderness.  Musculoskeletal: Normal range of motion. She exhibits no edema.  Neurological: She is oriented to person, place, and time. She appears listless. GCS eye subscore is 4. GCS verbal subscore is 1. GCS motor subscore is 5.  Right sided flaccid, unable to follow commands on left. Neuro exam limited 2/2 pts baseline deficit  Skin: Skin is warm and dry. No rash noted.  Psychiatric: She has a normal mood and affect. Her behavior is normal.    ED Course  Procedures (including critical care time) Results for orders placed during the hospital encounter of 01/24/13  CBC WITH DIFFERENTIAL      Result Value Range   WBC 7.5  4.0 - 10.5 K/uL   RBC 4.40  3.87 - 5.11 MIL/uL   Hemoglobin 12.3  12.0 - 15.0 g/dL   HCT 16.1  09.6 - 04.5 %   MCV 83.9  78.0 - 100.0 fL   MCH 28.0  26.0 - 34.0 pg   MCHC 33.3  30.0 - 36.0 g/dL   RDW 40.9  81.1 - 91.4 %   Platelets 196  150 - 400 K/uL   Neutrophils Relative % 68  43 - 77 %   Neutro Abs 5.1  1.7 - 7.7 K/uL   Lymphocytes Relative 22  12 - 46 %   Lymphs Abs 1.7  0.7 - 4.0 K/uL   Monocytes Relative 8  3 - 12 %   Monocytes Absolute 0.6  0.1 - 1.0 K/uL   Eosinophils Relative 1  0 - 5 %   Eosinophils Absolute 0.1  0.0 - 0.7 K/uL   Basophils Relative 0  0 - 1 %   Basophils Absolute 0.0  0.0 - 0.1 K/uL  COMPREHENSIVE METABOLIC PANEL      Result Value Range   Sodium 142  135 - 145 mEq/L   Potassium 3.7  3.5 - 5.1 mEq/L   Chloride 103  96 - 112  mEq/L   CO2 28  19 - 32 mEq/L   Glucose, Bld 133 (*) 70 - 99 mg/dL   BUN 17  6 - 23 mg/dL   Creatinine, Ser  6.57 (*) 0.50 - 1.10 mg/dL   Calcium 9.6  8.4 - 84.6 mg/dL   Total Protein 6.6  6.0 - 8.3 g/dL   Albumin 3.2 (*) 3.5 - 5.2 g/dL   AST 35  0 - 37 U/L   ALT 12  0 - 35 U/L   Alkaline Phosphatase 93  39 - 117 U/L   Total Bilirubin 0.4  0.3 - 1.2 mg/dL   GFR calc non Af Amer 41 (*) >90 mL/min   GFR calc Af Amer 47 (*) >90 mL/min  TROPONIN I      Result Value Range   Troponin I <0.30  <0.30 ng/mL  CG4 I-STAT (LACTIC ACID)      Result Value Range   Lactic Acid, Venous 1.18  0.5 - 2.2 mmol/L  POCT I-STAT, CHEM 8      Result Value Range   Sodium 143  135 - 145 mEq/L   Potassium 3.5  3.5 - 5.1 mEq/L   Chloride 104  96 - 112 mEq/L   BUN 17  6 - 23 mg/dL   Creatinine, Ser 9.62  0.50 - 1.10 mg/dL   Glucose, Bld 952 (*) 70 - 99 mg/dL   Calcium, Ion 8.41  3.24 - 1.30 mmol/L   TCO2 25  0 - 100 mmol/L   Hemoglobin 13.3  12.0 - 15.0 g/dL   HCT 40.1  02.7 - 25.3 %     CT Head Wo Contrast (Final result)  Result time: 01/24/13 21:37:17    Final result by Rad Results In Interface (01/24/13 21:37:17)    Narrative:   *RADIOLOGY REPORT*  Clinical Data: Status post fall with right eye bleed  CT HEAD WITHOUT CONTRAST  Technique: Contiguous axial images were obtained from the base of the skull through the vertex without contrast.  Comparison: Nov 15, 2005  Findings: There is a 5 mm focus of increased density in the left frontal periventricular white matter on image 20 suspicious for acute hemorrhage. There is chronic diffuse atrophy. Chronic bilateral periventricular matter small vessel ischemic change is identified. There is evidence of old infarct in the left sub insula. There is a 2 mm focus of focal calcification in the extra- axial space left frontal region on image 15 unchanged. There is minimal deformity of the lateral aspect of the right nasal bone. Evaluation of the bones are limited by patient motion.  IMPRESSION: A 5 mm focus of increased density in the left  frontal periventricular white matter suspicious for a small focus of acute hemorrhage. Minimal deformity at the lateral aspect of the right nasal bone, evaluation of the bones are limited by patient motion. Correlate with maxillofacial CT of today. Stable chronic changes.   Original Report Authenticated By: Sherian Rein, M.D.             CT Cervical Spine Wo Contrast (Final result)  Result time: 01/24/13 22:12:58    Final result by Rad Results In Interface (01/24/13 22:12:58)    Narrative:   *RADIOLOGY REPORT*  Clinical Data: Status post fall with right eye bleed  CT CERVICAL SPINE WITHOUT CONTRAST,CT MAXILLOFACIAL WITHOUT CONTRAST  Technique: Multidetector CT imaging of the maxillofacial structures and cervical spine were performed following the standard protocol without intravenous contrast. Multiplanar CT image reconstructions of the maxillofacial structures and cervical  spine were also generated.  Cervical spine:  Findings: There is no acute fracture or dislocation. There are degenerative joint changes of cervical spine with narrowed joint space and osteophyte formation as well as facet joint sclerosis. The prevertebral soft tissue is normal. There is right pleural effusion with probable superimposed pleural thickening.  IMPRESSION: No acute fracture or dislocation of cervical spine. Degenerative joint changes of cervical spine.  Maxillofacial:  Findings: There is fracture right nasal bone with mild deformity of the right nasal bone, new compared with prior head CT of May 2007. No other acute displaced fracture or dislocation is identified. The sinuses are clear. The orbits are normal. There is mild soft tissue swelling over right eyelid.  Impression: Mild acute fracture of the right nasal bone as described. Mild soft tissue swelling over right eyelid.   Original Report Authenticated By: Sherian Rein, M.D.             CT Maxillofacial WO CM (Final  result)  Result time: 01/24/13 22:12:57    Final result by Rad Results In Interface (01/24/13 22:12:57)    Narrative:   *RADIOLOGY REPORT*  Clinical Data: Status post fall with right eye bleed  CT CERVICAL SPINE WITHOUT CONTRAST,CT MAXILLOFACIAL WITHOUT CONTRAST  Technique: Multidetector CT imaging of the maxillofacial structures and cervical spine were performed following the standard protocol without intravenous contrast. Multiplanar CT image reconstructions of the maxillofacial structures and cervical spine were also generated.  Cervical spine:  Findings: There is no acute fracture or dislocation. There are degenerative joint changes of cervical spine with narrowed joint space and osteophyte formation as well as facet joint sclerosis. The prevertebral soft tissue is normal. There is right pleural effusion with probable superimposed pleural thickening.  IMPRESSION: No acute fracture or dislocation of cervical spine. Degenerative joint changes of cervical spine.  Maxillofacial:  Findings: There is fracture right nasal bone with mild deformity of the right nasal bone, new compared with prior head CT of May 2007. No other acute displaced fracture or dislocation is identified. The sinuses are clear. The orbits are normal. There is mild soft tissue swelling over right eyelid.  Impression: Mild acute fracture of the right nasal bone as described. Mild soft tissue swelling over right eyelid.   Original Report Authenticated By: Sherian Rein, M.D.             DG CHEST PORT 1 VIEW (Final result)  Result time: 01/24/13 19:44:37    Final result by Rad Results In Interface (01/24/13 19:44:37)    Narrative:   *RADIOLOGY REPORT*  Clinical Data: Hypertension, status post fall.  PORTABLE CHEST - 1 VIEW  Comparison: Nov 12, 2005  Findings: There is patchy opacity in the left lung base. There is no pulmonary edema or pleural effusion. The heart size is enlarged. The  aorta is tortuous. The soft tissues and osseous structures are stable.  IMPRESSION: Patchy consolidation of left lung base, could represent pneumonia.   Original Report Authenticated By: Sherian Rein, M.D.         Date: 01/25/2013  Rate: 166  Rhythm: atrial fibrillation  QRS Axis: normal  Intervals: normal  ST/T Wave abnormalities: normal  Conduction Disutrbances:none  Narrative Interpretation:   Old EKG Reviewed: none available    No diagnosis found.  MDM  Exam as above, no hypoxia or resp distress, AF w/ RVR, normotensive, GCS 10  (4,1,5), non verbal. At baseline per daughter. ECG AF w/ RVE w/out acute ischemia. Troponin negative. Placed on dilt w/ bolus  and gtt w/ improvement of rate. CXR c/w likely LLL infiltrate - likely health care associated - lives in [place]. Given vanc/zosyn. CT head w/ 5mm focus left frontal periventricular white matter concerning for acute hemorrhage, d/w Dr Wynetta Emery - neurosurgery - no acute intervention. Will follow up in am. CT face w/ right nasal bone fx - no nasal septal hematoma. CT cervical spine - degenerative changes - no fx. Labs unremarkable. D/w internal medicine and pt admitted to hospital for pneumonia, new onset AF w/ RVR, possible intracranial hemorrhage. Admit in serious condition.   I have personally reviewed labs and imaging and considered in my MDM. Case d/w Dr Jeraldine Loots  1. Healthcare-associated pneumonia   2. Unspecified intracranial hemorrhage   3. Fall from wheelchair, initial encounter   4. Nasal bone fracture, closed, initial encounter   5. Atrial fibrillation with RVR      Audelia Hives, MD 01/25/13 [phone]

## 2013-01-24 NOTE — H&P (Signed)
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 At baseline can answer yes and no questions, but otherwise moans and is nonverbal. At baseline in a wheelchair, possibly self propelling, today while in the hallway, patient fell from the wheelchair with purposeful movement, striking the right side against the ground without loss of consciousness. Patient did sustain a laceration to the nasal bridge as well as right ocular area was significant bleeding. Patient was wearing glasses. No change from patient's baseline mental status at the time however given prolonged bleeding, patient was transported to the emergency room for further evaluation and management. In emergency room the patient was noted to be in atrial fibrillation with rapid ventricular response, was hemodynamically stable help her heart rate varied from the upper 80s to 180. She was normotensive, and in no respiratory distress, oxygen levels normal on room air. Patient remained nonverbal except for laughing. Head CT also found a 5 mm area of hemorrhage, neurosurgery consult to by ER physician, no indications for further treatment or management based on current condition. Patient is now being admitted for her atrial fibrillation with rapid ventricular response, questionable pneumonia, however per daughter who is present, patient has a baseline chest x-ray with abnormalities and has been told that she has chronic pneumonia. Patient apparently does have a history of coronary artery disease with some sort of obstruction in the LAD per daughter's report but this dates back 20 years ago. She's not had any cardiovascular issues since  then. Patient is DO NOT RESUSCITATE/DO NOT INTUBATE, confirmed by the patient's daughter he was a legal guardian. After discussion, the patient's daughter wishes conservative measures only. In emergency room the patient as are Rady started on Zosyn and vancomycin given her residency status in a skilled nursing facility for presumed pneumonia as well as a diltiazem drip given her RVR.  Review of Systems:  Unable to get secondary to mental status but no reports of recent fevers or chills or significant respiratory distress or cough  Past Medical History: Past Medical History  Diagnosis Date  . Aphasia   . Dysphagia   . Facial weakness   . Stroke   . Depression   . Hypertension   . Glaucoma    History reviewed. No pertinent past surgical history.  Medications: Prior to Admission medications   Medication Sig Start Date End Date Taking? Authorizing Provider  albuterol (PROVENTIL) (2.5 MG/3ML) 0.083% nebulizer solution Take 2.5 mg by nebulization every 6 (six) hours as needed for wheezing.   Yes Historical Provider, MD  amLODipine (NORVASC) 5 MG tablet Take 5 mg by mouth daily.   Yes Historical Provider, MD  atorvastatin (LIPITOR) 20 MG tablet Take 20 mg by mouth daily.   Yes Historical Provider, MD  calcium-vitamin D (OSCAL WITH D) 500-200 MG-UNIT per tablet Take 1 tablet by mouth 3 (three) times daily.   Yes Historical Provider, MD  dipyridamole-aspirin (AGGRENOX) 200-25 MG per 12 hr capsule Take 1 capsule by mouth 2 (two) times daily.   Yes Historical Provider, MD  fluticasone (FLONASE) 50 MCG/ACT nasal spray Place 2 sprays into the nose daily.   Yes Historical Provider, MD  levothyroxine (SYNTHROID, LEVOTHROID) 50 MCG tablet Take 50 mcg by  mouth daily before breakfast.   Yes Historical Provider, MD  loratadine (CLARITIN) 10 MG tablet Take 10 mg by mouth daily.   Yes Historical Provider, MD  Polyethyl Glycol-Propyl Glycol (SYSTANE PRESERVATIVE FREE) 0.4-0.3 % SOLN Place 1 drop into both  eyes 2 (two) times daily.   Yes Historical Provider, MD  sertraline (ZOLOFT) 50 MG tablet Take 50 mg by mouth daily.   Yes Historical Provider, MD  Travoprost, BAK Free, (TRAVATAN) 0.004 % SOLN ophthalmic solution Place 1 drop into both eyes at bedtime.   Yes Historical Provider, MD  Vitamin D, Ergocalciferol, (DRISDOL) 50000 UNITS CAPS Take 50,000 Units by mouth every 7 (seven) days.   Yes Historical Provider, MD    Allergies:  No Known Allergies  Social History:  reports that she has never smoked. She does not have any smokeless tobacco history on file. She reports that she does not drink alcohol or use illicit drugs. Patient resides at [place] to a skilled nursing facility the last 7 years, at baseline, remains in a wheelchair, nonverbal  Family History: No family history on file.  Physical Exam: Filed Vitals:   01/24/13 1900 01/24/13 2125 01/24/13 2135 01/24/13 2146  BP: 139/96 106/56  132/111  Pulse: 116 74  121  Temp:      TempSrc:      Resp: 19 20  30   Height:   5' (1.524 m)   Weight:   72.576 kg (160 lb)   SpO2: 95% 97%  95%   Physical exam Frail elderly Caucasian female lying flat in bed no apparent distress, dry blood surrounding the right eye, extraocular movements are intact, no apparent distress, laughing when asked questions. Face appears symmetric, small laceration at the corner of the right eye, no active bleeding, sclera clear, neck supple, patient resistant to oral exam, no cervical lymphadenopathy No respiratory distress and lungs appear clear with no bronchospasm or wheezing auscultated anteriorly Cardiovascular reveals an irregularly irregular rhythm rapid Abdomen-soft, nontender, nondistended, bowel sounds present Trace edema pedal pulses intact, soft brace on her right lower extremity, wanting his movement of left upper extremity and left lower extremity, minimal movement but some perceptible on the right lower extremity and right upper extremity, with mild  tremor   Labs on Admission:   Recent Labs  01/24/13 1946 01/24/13 2014  NA 142 143  K 3.7 3.5  CL 103 104  CO2 28  --   GLUCOSE 133* 109*  BUN 17 17  CREATININE 1.13* 1.00  CALCIUM 9.6  --     Recent Labs  01/24/13 1946  AST 35  ALT 12  ALKPHOS 93  BILITOT 0.4  PROT 6.6  ALBUMIN 3.2*   No results found for this basename: LIPASE, AMYLASE,  in the last 72 hours  Recent Labs  01/24/13 1946 01/24/13 2014  WBC 7.5  --   NEUTROABS 5.1  --   HGB 12.3 13.3  HCT 36.9 39.0  MCV 83.9  --   PLT 196  --     Recent Labs  01/24/13 1946  TROPONINI <0.30   No results found for this basename: TSH, T4TOTAL, FREET3, T3FREE, THYROIDAB,  in the last 72 hours No results found for this basename: VITAMINB12, FOLATE, FERRITIN, TIBC, IRON, RETICCTPCT,  in the last 72 hours  Radiological Exams on Admission: Ct Head Wo Contrast  01/24/2013   *RADIOLOGY REPORT*  Clinical Data: Status post fall with right eye bleed  CT HEAD WITHOUT CONTRAST  Technique:  Contiguous axial images were  obtained from the base of the skull through the vertex without contrast.  Comparison: Nov 15, 2005  Findings: There is a 5 mm focus of increased density in the left frontal periventricular white matter on image 20 suspicious for acute hemorrhage.  There is chronic diffuse atrophy.  Chronic bilateral periventricular matter small vessel ischemic change is identified.  There is evidence of old infarct in the left sub insula. There is a 2 mm focus of focal calcification in the extra- axial space left frontal region on image 15 unchanged.  There is minimal deformity of the lateral aspect of the right nasal bone. Evaluation of the bones are limited by patient motion.  IMPRESSION: A 5 mm focus of increased density in the left frontal periventricular white matter suspicious for a small focus of acute hemorrhage.  Minimal deformity at the lateral aspect of the right nasal bone, evaluation of the bones are limited by patient  motion. Correlate with maxillofacial CT of today.  Stable chronic changes.   Original Report Authenticated By: Sherian Rein, M.D.   Ct Cervical Spine Wo Contrast  01/24/2013   *RADIOLOGY REPORT*  Clinical Data: Status post fall with right eye bleed  CT CERVICAL SPINE WITHOUT CONTRAST,CT MAXILLOFACIAL WITHOUT CONTRAST  Technique:  Multidetector CT imaging of the maxillofacial structures and cervical spine were performed following the standard protocol without intravenous contrast.  Multiplanar CT image reconstructions of the maxillofacial structures and cervical spine were also generated.  Cervical spine:  Findings: There is no acute fracture or dislocation.  There are degenerative joint changes of cervical spine with narrowed joint space and osteophyte formation as well as facet joint sclerosis. The prevertebral soft tissue is normal.  There is right pleural effusion with probable superimposed pleural thickening.  IMPRESSION: No acute fracture or dislocation of cervical spine.  Degenerative joint changes of cervical spine.  Maxillofacial:  Findings:  There is fracture right nasal bone with mild deformity of the right nasal bone, new compared with prior head CT of May 2007. No other acute displaced fracture or dislocation is identified.  The sinuses are clear.  The orbits are normal.  There is mild soft tissue swelling over right eyelid.  Impression:  Mild acute fracture of the right nasal bone as described.  Mild soft tissue swelling over right eyelid.   Original Report Authenticated By: Sherian Rein, M.D.   Dg Chest Port 1 View  01/24/2013   *RADIOLOGY REPORT*  Clinical Data: Hypertension, status post fall.  PORTABLE CHEST - 1 VIEW  Comparison: Nov 12, 2005  Findings: There is patchy opacity in the left lung base.  There is no pulmonary edema or pleural effusion.  The heart size is enlarged.  The aorta is tortuous.  The soft tissues and osseous structures are stable.  IMPRESSION: Patchy consolidation of left  lung base, could represent pneumonia.   Original Report Authenticated By: Sherian Rein, M.D.   Ct Maxillofacial Wo Cm  01/24/2013   *RADIOLOGY REPORT*  Clinical Data: Status post fall with right eye bleed  CT CERVICAL SPINE WITHOUT CONTRAST,CT MAXILLOFACIAL WITHOUT CONTRAST  Technique:  Multidetector CT imaging of the maxillofacial structures and cervical spine were performed following the standard protocol without intravenous contrast.  Multiplanar CT image reconstructions of the maxillofacial structures and cervical spine were also generated.  Cervical spine:  Findings: There is no acute fracture or dislocation.  There are degenerative joint changes of cervical spine with narrowed joint space and osteophyte formation as well as facet joint sclerosis. The prevertebral  soft tissue is normal.  There is right pleural effusion with probable superimposed pleural thickening.  IMPRESSION: No acute fracture or dislocation of cervical spine.  Degenerative joint changes of cervical spine.  Maxillofacial:  Findings:  There is fracture right nasal bone with mild deformity of the right nasal bone, new compared with prior head CT of May 2007. No other acute displaced fracture or dislocation is identified.  The sinuses are clear.  The orbits are normal.  There is mild soft tissue swelling over right eyelid.  Impression:  Mild acute fracture of the right nasal bone as described.  Mild soft tissue swelling over right eyelid.   Original Report Authenticated By: Sherian Rein, M.D.   Orders placed during the hospital encounter of 01/24/13  . ED EKG  . ED EKG    Assessment/Plan Atrial fibrillation with RVR-currently hemodynamically stable, oxygenating well, will increase diltiazem drip, admit to step down unit for titration, then transfer when appropriate to the floor with telemetry, will check echocardiogram, initial troponin negative, will cycle troponins for information purposes for the family however we'll not pursue  aggressively. Defer anticoagulation given intracranial hemorrhage-small.  Remote CVA with residual aphasia, right-sided weakness, nonverbal at baseline-on Aggrenox, will continue if stable prior to discharge however may need to discontinue if worsening neurologic deficits  Acute intracranial hemorrhage-small 5 mm in left frontal region, to be managed conservatively, no anticoagulants at this time, question reinitiation of Aggrenox a later time  Hypertension-we'll continue current medications to include calcium channel blocker  Right ocular bleed, appears to be mostly periorbital, question small laceration at the corner of the eye, patient is established with Dr. Deirdre Evener group for chronic conjunctivitis as well as glaucoma, we'll see if we can obtain consult for evaluation. To be called by rounding physician during business hours  Mild acute fracture of the right nasal bone-we'll manage conservatively, no apparent distress at this time  Pneumonia, left base, patient clinically asymptomatic, question whether this is residual from known abnormalities on patient's chest x-ray per guardian, will need to obtain reports from skilled nursing facility for comparison.  Hypothyroidism-given atrial fibrillation with RVR, will check TSH  DO NOT RESUSCITATE/DO NOT INTUBATE-discussed with legal guardian and daughter, Deliliah Spranger, conservative management desired, with no aggressive intervention requested by legal guardian.   Kenzlee Fishburn R 01/24/2013, 10:58 PM

## 2013-01-24 NOTE — ED Notes (Signed)
Patient from nursing home being pushed in a wheelchair fell forward hit bridge of nose abrasion light pink and right eye bleeding. Patient history of asphasia.  EMS left hand 20g with EKG a fib heart rate 80-180.  Placed in KED prior to arrival Patient nodding appropriate shakes head left to right if has pain. Face scale 0/10

## 2013-01-25 ENCOUNTER — Encounter (HOSPITAL_COMMUNITY): Payer: Self-pay | Admitting: *Deleted

## 2013-01-25 DIAGNOSIS — I319 Disease of pericardium, unspecified: Secondary | ICD-10-CM

## 2013-01-25 LAB — CBC
HCT: 36 % (ref 36.0–46.0)
Hemoglobin: 11.5 g/dL — ABNORMAL LOW (ref 12.0–15.0)
MCHC: 31.9 g/dL (ref 30.0–36.0)
RBC: 4.3 MIL/uL (ref 3.87–5.11)

## 2013-01-25 LAB — COMPREHENSIVE METABOLIC PANEL
ALT: 12 U/L (ref 0–35)
Calcium: 9.3 mg/dL (ref 8.4–10.5)
GFR calc Af Amer: 51 mL/min — ABNORMAL LOW (ref >90)
Glucose, Bld: 102 mg/dL — ABNORMAL HIGH (ref 70–99)
Sodium: 140 mEq/L (ref 135–145)
Total Protein: 5.9 g/dL — ABNORMAL LOW (ref 6.0–8.3)

## 2013-01-25 LAB — URINALYSIS, ROUTINE W REFLEX MICROSCOPIC
Nitrite: NEGATIVE
Specific Gravity, Urine: 1.024 (ref 1.005–1.030)
Urobilinogen, UA: 1 mg/dL (ref 0.0–1.0)

## 2013-01-25 LAB — TROPONIN I
Troponin I: <0.3 ng/mL (ref <0.30)
Troponin I: <0.3 ng/mL (ref <0.30)
Troponin I: <0.3 ng/mL (ref <0.30)

## 2013-01-25 LAB — URINE MICROSCOPIC-ADD ON

## 2013-01-25 LAB — MRSA PCR SCREENING: MRSA by PCR: POSITIVE — AB

## 2013-01-25 MED ORDER — VITAMIN D (ERGOCALCIFEROL) 1.25 MG (50000 UNIT) PO CAPS: 50000.0000 [IU] | ORAL_CAPSULE | ORAL | Status: DC

## 2013-01-25 MED ORDER — ACETAMINOPHEN 650 MG RE SUPP: 650.0000 mg | Freq: Four times a day (QID) | RECTAL | Status: DC | PRN

## 2013-01-25 MED ORDER — ACETAMINOPHEN 325 MG PO TABS: 650.0000 mg | ORAL_TABLET | Freq: Four times a day (QID) | ORAL | Status: DC | PRN

## 2013-01-25 MED ORDER — ATORVASTATIN CALCIUM 20 MG PO TABS
20.0000 mg | ORAL_TABLET | Freq: Every day | ORAL | Status: DC
  Administered 2013-01-25 – 2013-01-26 (×2): 20 mg via ORAL
  Filled 2013-01-25 (×3): qty 1

## 2013-01-25 MED ORDER — LEVOTHYROXINE SODIUM 50 MCG PO TABS
50.0000 ug | ORAL_TABLET | Freq: Every day | ORAL | Status: DC
  Administered 2013-01-25 – 2013-01-26 (×2): 50 ug via ORAL
  Filled 2013-01-25 (×4): qty 1

## 2013-01-25 MED ORDER — FLUTICASONE PROPIONATE 50 MCG/ACT NA SUSP
2.0000 | Freq: Every day | NASAL | Status: DC
  Administered 2013-01-25 – 2013-01-26 (×2): 2 via NASAL
  Filled 2013-01-25: qty 16

## 2013-01-25 MED ORDER — TRAVOPROST (BAK FREE) 0.004 % OP SOLN
1.0000 [drp] | Freq: Every day | OPHTHALMIC | Status: DC
  Administered 2013-01-25 – 2013-01-26 (×3): 1 [drp] via OPHTHALMIC
  Filled 2013-01-25: qty 2.5

## 2013-01-25 MED ORDER — LORATADINE 10 MG PO TABS
10.0000 mg | ORAL_TABLET | Freq: Every day | ORAL | Status: DC
  Administered 2013-01-25 – 2013-01-26 (×2): 10 mg via ORAL
  Filled 2013-01-25 (×3): qty 1

## 2013-01-25 MED ORDER — LEVOFLOXACIN IN D5W 750 MG/150ML IV SOLN
750.0000 mg | INTRAVENOUS | Status: DC
  Administered 2013-01-25: 750 mg via INTRAVENOUS
  Filled 2013-01-25 (×2): qty 150

## 2013-01-25 MED ORDER — SERTRALINE HCL 50 MG PO TABS
50.0000 mg | ORAL_TABLET | Freq: Every day | ORAL | Status: DC
  Administered 2013-01-25 – 2013-01-26 (×2): 50 mg via ORAL
  Filled 2013-01-25 (×3): qty 1

## 2013-01-25 MED ORDER — ALBUTEROL SULFATE (5 MG/ML) 0.5% IN NEBU: 2.5000 mg | INHALATION_SOLUTION | Freq: Four times a day (QID) | RESPIRATORY_TRACT | Status: DC | PRN

## 2013-01-25 MED ORDER — DILTIAZEM HCL 30 MG PO TABS
30.0000 mg | ORAL_TABLET | Freq: Four times a day (QID) | ORAL | Status: DC
  Administered 2013-01-25 – 2013-01-26 (×5): 30 mg via ORAL
  Filled 2013-01-25 (×10): qty 1

## 2013-01-25 MED ORDER — DILTIAZEM HCL 100 MG IV SOLR
5.0000 mg/h | INTRAVENOUS | Status: DC
  Administered 2013-01-25: 5 mg/h via INTRAVENOUS
  Filled 2013-01-25: qty 100

## 2013-01-25 MED ORDER — SODIUM CHLORIDE 0.45 % IV SOLN
INTRAVENOUS | Status: DC
  Administered 2013-01-25: 50 mL/h via INTRAVENOUS
  Administered 2013-01-25 – 2013-01-26 (×2): via INTRAVENOUS

## 2013-01-25 MED ORDER — SODIUM CHLORIDE 0.9 % IJ SOLN
3.0000 mL | Freq: Two times a day (BID) | INTRAMUSCULAR | Status: DC
  Administered 2013-01-25: 3 mL via INTRAVENOUS

## 2013-01-25 MED ORDER — PIPERACILLIN-TAZOBACTAM 3.375 G IVPB
3.3750 g | Freq: Three times a day (TID) | INTRAVENOUS | Status: DC
  Administered 2013-01-25: 3.375 g via INTRAVENOUS
  Filled 2013-01-25 (×2): qty 50

## 2013-01-25 MED ORDER — VANCOMYCIN HCL IN DEXTROSE 1-5 GM/200ML-% IV SOLN: 1000.0000 mg | INTRAVENOUS | Status: DC

## 2013-01-25 MED ORDER — POLYVINYL ALCOHOL 1.4 % OP SOLN
1.0000 [drp] | Freq: Two times a day (BID) | OPHTHALMIC | Status: DC
  Administered 2013-01-25 – 2013-01-26 (×5): 1 [drp] via OPHTHALMIC
  Filled 2013-01-25: qty 15

## 2013-01-25 NOTE — Progress Notes (Signed)
Patient ID: Amber Zamora, female   DOB: January 10, 1917, 79 y.o.   MRN: [id number] I reviewed the head CT which shows a very small 5 mm interventricular hemorrhage this is not needing any intervention and in light of the patient's medical condition and family's wishes to pursue conservative measures I will not officially consult on patient unless I am called. I am available as needed.

## 2013-01-25 NOTE — Progress Notes (Signed)
Physician Daily Progress Note  Subjective: Patient at baseline this AM Rate controlled < 100 on 5mg  dilt ggt  On room air  Afebrile Nonverbal at baseline Daughter at bedside   Objective: Vital signs in last 24 hours: Temp:  [97.6 F (36.4 C)-98.5 F (36.9 C)] 97.6 F (36.4 C) (07/24 0739) Pulse Rate:  [42-123] 96 (07/24 0739) Resp:  [14-33] 27 (07/24 0739) BP: (97-153)/(39-122) 151/91 mmHg (07/24 0739) SpO2:  [93 %-97 %] 97 % (07/24 0739) Weight:  [160 lb (72.576 kg)-160 lb 0.9 oz (72.6 kg)] 160 lb 0.9 oz (72.6 kg) (07/24 0145) Weight change:     CBG (last 3)  No results found for this basename: GLUCAP,  in the last 72 hours  Intake/Output from previous day:  Intake/Output Summary (Last 24 hours) at 01/25/13 0802 Last data filed at 01/25/13 0600  Gross per 24 hour  Intake 320.83 ml  Output    150 ml  Net 170.83 ml   07/23 0701 - 07/24 0700 In: 320.8 [I.V.:320.8] Out: 150 [Urine:150]  Physical Exam General appearance: WF w/ ecchymosis around R eye  Eyes: no scleral icterus. Dried blood on lid of R eye  Throat: oropharynx moist without erythema Resp: CTAB, crackles in L base  Cardio: ir/ir, reg rate, no MRG  GI: soft, non-tender; bowel sounds normal; no masses,  no organomegaly Extremities: no clubbing, cyanosis or edema   Lab Results:  Recent Labs  01/24/13 1946 01/24/13 2014  NA 142 143  K 3.7 3.5  CL 103 104  CO2 28  --   GLUCOSE 133* 109*  BUN 17 17  CREATININE 1.13* 1.00  CALCIUM 9.6  --      Recent Labs  01/24/13 1946  AST 35  ALT 12  ALKPHOS 93  BILITOT 0.4  PROT 6.6  ALBUMIN 3.2*     Recent Labs  01/24/13 1946 01/24/13 2014  WBC 7.5  --   NEUTROABS 5.1  --   HGB 12.3 13.3  HCT 36.9 39.0  MCV 83.9  --   PLT 196  --     No results found for this basename: INR, PROTIME     Recent Labs  01/24/13 1946 01/25/13 0123  TROPONINI <0.30 <0.30    No results found for this basename: TSH, T4TOTAL, FREET3, T3FREE,  THYROIDAB,  in the last 72 hours  No results found for this basename: VITAMINB12, FOLATE, FERRITIN, TIBC, IRON, RETICCTPCT,  in the last 72 hours  Micro Results: Recent Results (from the past 240 hour(s))  MRSA PCR SCREENING     Status: Abnormal   Collection Time    01/25/13 12:46 AM      Result Value Range Status   MRSA by PCR POSITIVE (*) NEGATIVE Final   Comment:            The GeneXpert MRSA Assay (FDA     approved for NASAL specimens     only), is one component of a     comprehensive MRSA colonization     surveillance program. It is not     intended to diagnose MRSA     infection nor to guide or     monitor treatment for     MRSA infections.     RESULT CALLED TO, READ BACK BY AND VERIFIED WITH:     CALLED TO RN,JORDAN PERKINS [id number] @[id number]  THANEY    Studies/Results: Ct Head Wo Contrast  01/24/2013   *RADIOLOGY REPORT*  Clinical Data: Status post fall with right eye  bleed  CT HEAD WITHOUT CONTRAST  Technique:  Contiguous axial images were obtained from the base of the skull through the vertex without contrast.  Comparison: Nov 15, 2005  Findings: There is a 5 mm focus of increased density in the left frontal periventricular white matter on image 20 suspicious for acute hemorrhage.  There is chronic diffuse atrophy.  Chronic bilateral periventricular matter small vessel ischemic change is identified.  There is evidence of old infarct in the left sub insula. There is a 2 mm focus of focal calcification in the extra- axial space left frontal region on image 15 unchanged.  There is minimal deformity of the lateral aspect of the right nasal bone. Evaluation of the bones are limited by patient motion.  IMPRESSION: A 5 mm focus of increased density in the left frontal periventricular white matter suspicious for a small focus of acute hemorrhage.  Minimal deformity at the lateral aspect of the right nasal bone, evaluation of the bones are limited by patient motion. Correlate with maxillofacial  CT of today.  Stable chronic changes.   Original Report Authenticated By: Sherian Rein, M.D.   Ct Cervical Spine Wo Contrast  01/24/2013   *RADIOLOGY REPORT*  Clinical Data: Status post fall with right eye bleed  CT CERVICAL SPINE WITHOUT CONTRAST,CT MAXILLOFACIAL WITHOUT CONTRAST  Technique:  Multidetector CT imaging of the maxillofacial structures and cervical spine were performed following the standard protocol without intravenous contrast.  Multiplanar CT image reconstructions of the maxillofacial structures and cervical spine were also generated.  Cervical spine:  Findings: There is no acute fracture or dislocation.  There are degenerative joint changes of cervical spine with narrowed joint space and osteophyte formation as well as facet joint sclerosis. The prevertebral soft tissue is normal.  There is right pleural effusion with probable superimposed pleural thickening.  IMPRESSION: No acute fracture or dislocation of cervical spine.  Degenerative joint changes of cervical spine.  Maxillofacial:  Findings:  There is fracture right nasal bone with mild deformity of the right nasal bone, new compared with prior head CT of May 2007. No other acute displaced fracture or dislocation is identified.  The sinuses are clear.  The orbits are normal.  There is mild soft tissue swelling over right eyelid.  Impression:  Mild acute fracture of the right nasal bone as described.  Mild soft tissue swelling over right eyelid.   Original Report Authenticated By: Sherian Rein, M.D.   Dg Chest Port 1 View  01/24/2013   *RADIOLOGY REPORT*  Clinical Data: Hypertension, status post fall.  PORTABLE CHEST - 1 VIEW  Comparison: Nov 12, 2005  Findings: There is patchy opacity in the left lung base.  There is no pulmonary edema or pleural effusion.  The heart size is enlarged.  The aorta is tortuous.  The soft tissues and osseous structures are stable.  IMPRESSION: Patchy consolidation of left lung base, could represent pneumonia.    Original Report Authenticated By: Sherian Rein, M.D.   Ct Maxillofacial Wo Cm  01/24/2013   *RADIOLOGY REPORT*  Clinical Data: Status post fall with right eye bleed  CT CERVICAL SPINE WITHOUT CONTRAST,CT MAXILLOFACIAL WITHOUT CONTRAST  Technique:  Multidetector CT imaging of the maxillofacial structures and cervical spine were performed following the standard protocol without intravenous contrast.  Multiplanar CT image reconstructions of the maxillofacial structures and cervical spine were also generated.  Cervical spine:  Findings: There is no acute fracture or dislocation.  There are degenerative joint changes of cervical spine with narrowed  joint space and osteophyte formation as well as facet joint sclerosis. The prevertebral soft tissue is normal.  There is right pleural effusion with probable superimposed pleural thickening.  IMPRESSION: No acute fracture or dislocation of cervical spine.  Degenerative joint changes of cervical spine.  Maxillofacial:  Findings:  There is fracture right nasal bone with mild deformity of the right nasal bone, new compared with prior head CT of May 2007. No other acute displaced fracture or dislocation is identified.  The sinuses are clear.  The orbits are normal.  There is mild soft tissue swelling over right eyelid.  Impression:  Mild acute fracture of the right nasal bone as described.  Mild soft tissue swelling over right eyelid.   Original Report Authenticated By: Sherian Rein, M.D.     Medications: Scheduled: . atorvastatin  20 mg Oral Daily  . diltiazem  30 mg Oral Q6H  . fluticasone  2 spray Each Nare Daily  . levothyroxine  50 mcg Oral QAC breakfast  . loratadine  10 mg Oral Daily  . polyvinyl alcohol  1 drop Both Eyes BID  . sertraline  50 mg Oral Daily  . sodium chloride  3 mL Intravenous Q12H  . Travoprost (BAK Free)  1 drop Both Eyes QHS  . [START ON 01/28/2013] Vitamin D (Ergocalciferol)  50,000 Units Oral Q Sun   Continuous: . sodium chloride  50 mL/hr (01/25/13 0144)    Assessment/Plan:  A fib w/ RVR - appears to be new onset. ordered TSH, TTE to eval etiology. Rate controlled on IV dilt overnight, converting to PO dilt today, initially at 30mg  q6hr and will advance dose as needed to keep HR < 100s. Holding on anticoagulation for the near future given high fall risk  (she presented w/ fall and nasal fracture and 5mm frontal intracerebral hemorrhage.) PCP can determine as outpt if they feel need to resume anticoagulation.   LLL HCAP - intially treated w/ vanc/zosyn on admission. Changing to levaquin given clinically stable w/o fever/leukocytosis.   UTI - based on U/A. Culture pending. Treating w/ levaquin.   Remote CVA - Holding aggrenox for the near future given she has 5mm intracerebral hemorrhage. PCP can resume in future if he feels warranted.   Acute Intracranial hemorrhage - 5mm in size in the frontal lobe. Given patient goals of comfort w/o major intervention, NSG has signed off for now with no new plans of action. Will reimage only if she has acute change in status.   HTN - cont home meds. Add ca channel blocker   R ocular bleed - controlled at this time. No need for urgent inpatient optho consult. They follow closely w/ Dr Elmer Picker as outpt.   R nasal fracture - manage conservatively. No active pain    DVT Prophylaxis - SCDs   Ethics - DNR/DNI w/ comfort as main goal.   Dispo - Transfer to tele bed today. If remains stable, I plan for D/C back to [facility] in 1-2 days. This is pending rate control on PO diltiazem and continued clinical stability in setting of PNA and 5mm intracerebral hemorrhage.    LOS: 1 day   Grainne Knights 01/25/2013, 8:02 AM

## 2013-01-25 NOTE — Progress Notes (Signed)
Nutrition Brief Note  Patient identified on the [facility] report.  Body mass index is 30.26 kg/(m^2). Patient meets criteria for obese class I based on current BMI.   Current diet order is Regular, patient is consuming approximately 25-50% of meals at this time per daughter. Labs and medications reviewed.   Per daughter, pt eats well as long as food as soft and well chopped.  No concerns for nutrition. RD will downgrade diet to Dysphagia 2, thin. No additional interventions. If nutrition issues arise, please consult RD.   Loyce Dys, MS RD LDN Clinical Inpatient Dietitian Pager: [phone] Weekend/After hours pager: [phone]

## 2013-01-25 NOTE — Progress Notes (Signed)
  Echocardiogram 2D Echocardiogram has been performed.  Arvil Chaco 01/25/2013, 3:06 PM

## 2013-01-25 NOTE — Care Management Note (Unsigned)
    Page 1 of 1   01/26/2013     2:52:37 PM   CARE MANAGEMENT NOTE 01/26/2013  Patient:  Amber Zamora, Amber Zamora   Account Number:  [id number]  Date Initiated:  01/25/2013  Documentation initiated by:  Junius Creamer  Subjective/Objective Assessment:   adm w at fib w rvr     Action/Plan:   from nsg facility   Anticipated DC Date:  01/27/2013   Anticipated DC Plan:  SKILLED NURSING FACILITY  In-house referral  Clinical Social Worker      DC Planning Services  CM consult      Choice offered to / List presented to:             Status of service:  In process, will continue to follow Medicare Important Message given?   (If response is "NO", the following Medicare IM given date fields will be blank) Date Medicare IM given:   Date Additional Medicare IM given:    Discharge Disposition:  SKILLED NURSING FACILITY  Per UR Regulation:  Reviewed for med. necessity/level of care/duration of stay  If discussed at Long Length of Stay Meetings, dates discussed:    Comments:  01/26/13 Amber Najarro,RN,BSN [phone] PT S/P FALL FROM [facility] AT NURSING FACILITY; FOUND TO BE IN AFIB IN ED.  PTA, PT RESIDES AT [facility].  CSW CONSULTED TO FACILITATE RETURN TO SNF WHEN STABLE. T

## 2013-01-25 NOTE — Progress Notes (Addendum)
ANTIBIOTIC CONSULT NOTE - INITIAL  Pharmacy Consult for Vancomycin/Zosyn-->Levaquin Indication: HCAP  No Known Allergies  Patient Measurements: Height: 5' (152.4 cm) Weight: 160 lb (72.576 kg) IBW/kg (Calculated) : 45.5 Adjusted Body Weight: 54 kg  Vital Signs: Temp: 98.5 F (36.9 C) (07/23 2343) Temp src: Oral (07/23 2343) BP: 110/79 mmHg (07/23 2343) Pulse Rate: 123 (07/23 2343) Intake/Output from previous day: 07/23 0701 - 07/24 0700 In: 50 [I.V.:50] Out: -  Intake/Output from this shift: Total I/O In: 50 [I.V.:50] Out: -   Labs:  Recent Labs  01/24/13 1946 01/24/13 2014  WBC 7.5  --   HGB 12.3 13.3  PLT 196  --   CREATININE 1.13* 1.00   Estimated Creatinine Clearance: 31.2 ml/min (by C-G formula based on Cr of 1).  Medical History: Past Medical History  Diagnosis Date  . Aphasia   . Dysphagia   . Facial weakness   . Stroke   . Depression   . Hypertension   . Glaucoma   . Right foot drop    Medications:  Anti-infectives   Start     Dose/Rate Route Frequency Ordered Stop   01/24/13 2215  vancomycin (VANCOCIN) IVPB 1000 mg/200 mL premix     1,000 mg 200 mL/hr over 60 Minutes Intravenous  Once 01/24/13 2156 01/25/13 0010   01/24/13 2200  vancomycin (VANCOCIN) 1,089 mg in sodium chloride 0.9 % 250 mL IVPB  Status:  Discontinued     15 mg/kg  72.6 kg 166.7 mL/hr over 90 Minutes Intravenous  Once 01/24/13 2106 01/24/13 2156   01/24/13 2115  piperacillin-tazobactam (ZOSYN) IVPB 3.375 g     3.375 g 100 mL/hr over 30 Minutes Intravenous  Once 01/24/13 2106 01/24/13 2302     Assessment: 79yo female admitted from SNF after a fall.  She is being treated empirically for possible health care associated pneumonia.  She has had a CVA with aphasia/dysphagia and cannot communicate very well, she has no known allergies on file.  She has age related renal dysfunction, creatinine of 1.0 but an estimated crcl of 59ml/min.  She has been given 1gm IV Vancomycin at ~  11PM last night and 3.375gm IV Zosyn at ~ 10PM last night.  Her CBC reveals a normal WBC and she id presently afebrile with no noted fever since admission.  Goal of Therapy:  Vancomycin trough level 15-20 mcg/ml  Plan:  1.  Continue with Vancomycin 1gm IV every 24 hours. 2.  Zosyn 3.375 gm IV every 8 hours 3.  Monitor renal function, culture data and plans for de-escalation of IV antibiotics  Nadara Mustard, PharmD., MS Clinical Pharmacist Pager:  [phone] Thank you for allowing pharmacy to be part of this patients care team. 01/25/2013,1:41 AM   Pharmacy has now received orders to de-escalate antibiotics to levaquin given clinical stability.   Plan: 1) Levaquin 750mg  IV q48 2) Follow renal function, LOT  Louie Casa 01/25/2013, 8:27 AM

## 2013-01-25 NOTE — ED Provider Notes (Signed)
I evaluated the patient with the resident physician (Dr. Gary Fleet).  I interpreted the radiographic findings and agree with the interpretation.  I also saw the ECG (if needed) and agree with the interpretation. The documentation is appropriate and accurate as provided, with the following addendum:  This elderly female, wheelchair bound, now presents after a fall from her wheelchair with hemodynamic instability.  Patient has no history of atrial fibrillation, but this is immediately apparent on initial evaluation.  While fracture of the fall were evaluated the patient was started on a diltiazem drip.  This medication seems to improve the patient's heart rate substantially. Patient's imaging demonstrated presence of a nasal bone fracture, which likely explains the trace amounts of blood about the right orbit. The patient's imaging also demonstrates presence of new intracranial hemorrhage. All findings were discussed with the patient's family member, and subsequently was held with both the internal medicine team in our neurosurgery team.  Given her substantial illness she required admission for further evaluation and management.  However, the patient did have substantial improvement in the emergency department.  CRITICAL CARE Performed by: Gerhard Munch Total critical care time: 35 Critical care time was exclusive of separately billable procedures and treating other patients. Critical care was necessary to treat or prevent imminent or life-threatening deterioration. Critical care was time spent personally by me on the following activities: development of treatment plan with patient and/or surrogate as well as nursing, discussions with consultants, evaluation of patient's response to treatment, examination of patient, obtaining history from patient or surrogate, ordering and performing treatments and interventions, ordering and review of laboratory studies, ordering and review of radiographic studies,  pulse oximetry and re-evaluation of patient's condition.    Gerhard Munch, MD 01/25/13 [id number]

## 2013-01-26 LAB — COMPREHENSIVE METABOLIC PANEL
ALT: 11 U/L (ref 0–35)
AST: 16 U/L (ref 0–37)
Albumin: 2.9 g/dL — ABNORMAL LOW (ref 3.5–5.2)
CO2: 25 mEq/L (ref 19–32)
Chloride: 105 mEq/L (ref 96–112)
GFR calc non Af Amer: 51 mL/min — ABNORMAL LOW (ref >90)
Sodium: 140 mEq/L (ref 135–145)
Total Bilirubin: 0.4 mg/dL (ref 0.3–1.2)

## 2013-01-26 LAB — CBC
Platelets: 162 10*3/uL (ref 150–400)
RBC: 4.27 MIL/uL (ref 3.87–5.11)
RDW: 14.9 % (ref 11.5–15.5)
WBC: 5.8 10*3/uL (ref 4.0–10.5)

## 2013-01-26 MED ORDER — DILTIAZEM HCL 60 MG PO TABS
60.0000 mg | ORAL_TABLET | Freq: Four times a day (QID) | ORAL | Status: DC
  Administered 2013-01-26 – 2013-01-27 (×3): 60 mg via ORAL
  Filled 2013-01-26 (×8): qty 1

## 2013-01-26 MED ORDER — DILTIAZEM HCL 30 MG PO TABS
30.0000 mg | ORAL_TABLET | Freq: Once | ORAL | Status: DC
  Filled 2013-01-26: qty 1

## 2013-01-26 MED ORDER — POTASSIUM CHLORIDE CRYS ER 20 MEQ PO TBCR
40.0000 meq | EXTENDED_RELEASE_TABLET | Freq: Once | ORAL | Status: AC
  Administered 2013-01-26: 40 meq via ORAL
  Filled 2013-01-26: qty 1

## 2013-01-26 MED ORDER — POTASSIUM CHLORIDE 20 MEQ PO PACK
40.0000 meq | PACK | Freq: Once | ORAL | Status: DC
  Filled 2013-01-26: qty 2

## 2013-01-26 NOTE — Progress Notes (Signed)
Pt has refused to take anything PO today, including medications, fluids, and any food offered.  Daughter's suggestions of preferences were also turned down as a means to try to incorporate crushed meds that way.  Pt whimpers and sobs when you try to encourage her to take POs.  Daughter is aware and agrees nto to try to force pt to take POs to point of upsetting her.  Will continue to monitor and try to encourage. Ave Filter

## 2013-01-26 NOTE — Progress Notes (Signed)
Patient admitted from [facility] SNF- bed hold confirmed at SNF as well as for possible readmit over weekend- weekend CSW will f/u for ?d/c. FL2 on chart for MD signature- Reece Levy, MSW [phone]

## 2013-01-26 NOTE — Progress Notes (Signed)
Physician Daily Progress Note  Subjective: NO events overnight.  Resting comfortably this AM  Objective: Vital signs in last 24 hours: Temp:  [97.4 F (36.3 C)-98.9 F (37.2 C)] 98.4 F (36.9 C) (07/25 0412) Pulse Rate:  [50-136] 136 (07/25 0556) Resp:  [13-31] 20 (07/25 0412) BP: (122-151)/(67-105) 147/101 mmHg (07/25 0412) SpO2:  [94 %-98 %] 96 % (07/25 0412) Weight change:     CBG (last 3)  No results found for this basename: GLUCAP,  in the last 72 hours  Intake/Output from previous day:  Intake/Output Summary (Last 24 hours) at 01/26/13 0703 Last data filed at 01/25/13 2130  Gross per 24 hour  Intake    925 ml  Output      0 ml  Net    925 ml   07/24 0701 - 07/25 0700 In: 925 [P.O.:300; I.V.:475; IV Piggyback:150] Out: -   Physical Exam General appearance: WF w/ ecchymosis around R eye  Eyes: no scleral icterus. Dried blood on lid of R eye  Throat: oropharynx moist without erythema Resp: CTAB, crackles in L base  Cardio: ir/ir, reg rate, no MRG  GI: soft, non-tender; bowel sounds normal; no masses,  no organomegaly Extremities: no clubbing, cyanosis or edema   Lab Results:  Recent Labs  01/25/13 0810 01/26/13 0455  NA 140 140  K 3.3* 3.2*  CL 103 105  CO2 26 25  GLUCOSE 102* 99  BUN 13 9  CREATININE 1.06 0.93  CALCIUM 9.3 9.1     Recent Labs  01/25/13 0810 01/26/13 0455  AST 17 16  ALT 12 11  ALKPHOS 87 82  BILITOT 0.5 0.4  PROT 5.9* 5.9*  ALBUMIN 3.0* 2.9*     Recent Labs  01/24/13 1946  01/25/13 0810 01/26/13 0455  WBC 7.5  --  4.9 5.8  NEUTROABS 5.1  --   --   --   HGB 12.3  < > 11.5* 11.5*  HCT 36.9  < > 36.0 35.8*  MCV 83.9  --  83.7 83.8  PLT 196  --  183 162  < > = values in this interval not displayed.  No results found for this basename: INR,  PROTIME     Recent Labs  01/25/13 0123 01/25/13 0810 01/25/13 1433  TROPONINI <0.30 <0.30 <0.30     Recent Labs  01/25/13 0810  TSH 2.047    No results  found for this basename: VITAMINB12, FOLATE, FERRITIN, TIBC, IRON, RETICCTPCT,  in the last 72 hours  Micro Results: Recent Results (from the past 240 hour(s))  MRSA PCR SCREENING     Status: Abnormal   Collection Time    01/25/13 12:46 AM      Result Value Range Status   MRSA by PCR POSITIVE (*) NEGATIVE Final   Comment:            The GeneXpert MRSA Assay (FDA     approved for NASAL specimens     only), is one component of a     comprehensive MRSA colonization     surveillance program. It is not     intended to diagnose MRSA     infection nor to guide or     monitor treatment for     MRSA infections.     RESULT CALLED TO, READ BACK BY AND VERIFIED WITH:     CALLED TO RN,JORDAN PERKINS [phone] @[phone]  THANEY    Studies/Results: Ct Head Wo Contrast  01/24/2013   *RADIOLOGY REPORT*  Clinical  Data: Status post fall with right eye bleed  CT HEAD WITHOUT CONTRAST  Technique:  Contiguous axial images were obtained from the base of the skull through the vertex without contrast.  Comparison: Nov 15, 2005  Findings: There is a 5 mm focus of increased density in the left frontal periventricular white matter on image 20 suspicious for acute hemorrhage.  There is chronic diffuse atrophy.  Chronic bilateral periventricular matter small vessel ischemic change is identified.  There is evidence of old infarct in the left sub insula. There is a 2 mm focus of focal calcification in the extra- axial space left frontal region on image 15 unchanged.  There is minimal deformity of the lateral aspect of the right nasal bone. Evaluation of the bones are limited by patient motion.  IMPRESSION: A 5 mm focus of increased density in the left frontal periventricular white matter suspicious for a small focus of acute hemorrhage.  Minimal deformity at the lateral aspect of the right nasal bone, evaluation of the bones are limited by patient motion. Correlate with maxillofacial CT of today.  Stable chronic changes.    Original Report Authenticated By: Sherian Rein, M.D.   Ct Cervical Spine Wo Contrast  01/24/2013   *RADIOLOGY REPORT*  Clinical Data: Status post fall with right eye bleed  CT CERVICAL SPINE WITHOUT CONTRAST,CT MAXILLOFACIAL WITHOUT CONTRAST  Technique:  Multidetector CT imaging of the maxillofacial structures and cervical spine were performed following the standard protocol without intravenous contrast.  Multiplanar CT image reconstructions of the maxillofacial structures and cervical spine were also generated.  Cervical spine:  Findings: There is no acute fracture or dislocation.  There are degenerative joint changes of cervical spine with narrowed joint space and osteophyte formation as well as facet joint sclerosis. The prevertebral soft tissue is normal.  There is right pleural effusion with probable superimposed pleural thickening.  IMPRESSION: No acute fracture or dislocation of cervical spine.  Degenerative joint changes of cervical spine.  Maxillofacial:  Findings:  There is fracture right nasal bone with mild deformity of the right nasal bone, new compared with prior head CT of May 2007. No other acute displaced fracture or dislocation is identified.  The sinuses are clear.  The orbits are normal.  There is mild soft tissue swelling over right eyelid.  Impression:  Mild acute fracture of the right nasal bone as described.  Mild soft tissue swelling over right eyelid.   Original Report Authenticated By: Sherian Rein, M.D.   Dg Chest Port 1 View  01/24/2013   *RADIOLOGY REPORT*  Clinical Data: Hypertension, status post fall.  PORTABLE CHEST - 1 VIEW  Comparison: Nov 12, 2005  Findings: There is patchy opacity in the left lung base.  There is no pulmonary edema or pleural effusion.  The heart size is enlarged.  The aorta is tortuous.  The soft tissues and osseous structures are stable.  IMPRESSION: Patchy consolidation of left lung base, could represent pneumonia.   Original Report Authenticated By:  Sherian Rein, M.D.   Ct Maxillofacial Wo Cm  01/24/2013   *RADIOLOGY REPORT*  Clinical Data: Status post fall with right eye bleed  CT CERVICAL SPINE WITHOUT CONTRAST,CT MAXILLOFACIAL WITHOUT CONTRAST  Technique:  Multidetector CT imaging of the maxillofacial structures and cervical spine were performed following the standard protocol without intravenous contrast.  Multiplanar CT image reconstructions of the maxillofacial structures and cervical spine were also generated.  Cervical spine:  Findings: There is no acute fracture or dislocation.  There are degenerative  joint changes of cervical spine with narrowed joint space and osteophyte formation as well as facet joint sclerosis. The prevertebral soft tissue is normal.  There is right pleural effusion with probable superimposed pleural thickening.  IMPRESSION: No acute fracture or dislocation of cervical spine.  Degenerative joint changes of cervical spine.  Maxillofacial:  Findings:  There is fracture right nasal bone with mild deformity of the right nasal bone, new compared with prior head CT of May 2007. No other acute displaced fracture or dislocation is identified.  The sinuses are clear.  The orbits are normal.  There is mild soft tissue swelling over right eyelid.  Impression:  Mild acute fracture of the right nasal bone as described.  Mild soft tissue swelling over right eyelid.   Original Report Authenticated By: Sherian Rein, M.D.     Medications: Scheduled: . atorvastatin  20 mg Oral Daily  . diltiazem  30 mg Oral Once  . diltiazem  60 mg Oral Q6H  . fluticasone  2 spray Each Nare Daily  . levofloxacin (LEVAQUIN) IV  750 mg Intravenous Q48H  . levothyroxine  50 mcg Oral QAC breakfast  . loratadine  10 mg Oral Daily  . polyvinyl alcohol  1 drop Both Eyes BID  . potassium chloride  40 mEq Oral Once  . sertraline  50 mg Oral Daily  . sodium chloride  3 mL Intravenous Q12H  . Travoprost (BAK Free)  1 drop Both Eyes QHS  . [START ON  01/28/2013] Vitamin D (Ergocalciferol)  50,000 Units Oral Q Sun   Continuous: . sodium chloride 50 mL/hr at 01/25/13 2335    Assessment/Plan:  A fib w/ RVR - appears to be new onset. TSH, TTE unremarkable.  HR 80-130 on dilt PO 30mg  qhrs, increasing to 60 q6hours, and continue to monitor for adequate rate control. Holding on anticoagulation for the near future given high fall risk  (she presented w/ fall and nasal fracture and 5mm frontal intracerebral hemorrhage.) PCP can determine as outpt if they feel need to resume anticoagulation.   LLL HCAP - intially treated w/ vanc/zosyn on admission. Continue on levaquin given clinically stable w/o fever/leukocytosis.   UTI - based on U/A. Culture pending. Treating w/ levaquin.   Remote CVA - Holding aggrenox for the near future given she has 5mm intracerebral hemorrhage. PCP can resume in future if he feels warranted.   Acute Intracranial hemorrhage - 5mm in size in the frontal lobe. Given patient goals of comfort w/o major intervention, NSG has signed off for now with no new plans of action. Will reimage only if she has acute change in status.   HTN - cont home meds. Add ca channel blocker as above   R ocular bleed - controlled at this time. No need for urgent inpatient optho consult. They follow closely w/ Dr Elmer Picker as outpt.   R nasal fracture - manage conservatively. No active pain    DVT Prophylaxis - SCDs   Ethics - DNR/DNI w/ comfort as main goal.   Dispo - If we can obtain adequate rate control, I plan for D/C back to [place] in 1-2 days.     LOS: 2 days   Amber Zamora 01/26/2013, 7:03 AM

## 2013-01-27 LAB — CBC
HCT: 35.5 % — ABNORMAL LOW (ref 36.0–46.0)
Hemoglobin: 11.8 g/dL — ABNORMAL LOW (ref 12.0–15.0)
MCH: 27.8 pg (ref 26.0–34.0)
MCHC: 33.2 g/dL (ref 30.0–36.0)

## 2013-01-27 LAB — COMPREHENSIVE METABOLIC PANEL
Alkaline Phosphatase: 81 U/L (ref 39–117)
BUN: 9 mg/dL (ref 6–23)
Calcium: 9.2 mg/dL (ref 8.4–10.5)
GFR calc Af Amer: 57 mL/min — ABNORMAL LOW (ref >90)
GFR calc non Af Amer: 49 mL/min — ABNORMAL LOW (ref >90)
Glucose, Bld: 102 mg/dL — ABNORMAL HIGH (ref 70–99)
Potassium: 3.9 mEq/L (ref 3.5–5.1)
Total Protein: 5.9 g/dL — ABNORMAL LOW (ref 6.0–8.3)

## 2013-01-27 MED ORDER — DILTIAZEM HCL 60 MG PO TABS: 60.0000 mg | ORAL_TABLET | Freq: Four times a day (QID) | ORAL | Status: DC

## 2013-01-27 MED ORDER — LEVOFLOXACIN 750 MG PO TABS
750.0000 mg | ORAL_TABLET | ORAL | Status: DC
  Filled 2013-01-27: qty 1

## 2013-01-27 MED ORDER — LEVOFLOXACIN IN D5W 750 MG/150ML IV SOLN: INTRAVENOUS | Status: DC

## 2013-01-27 NOTE — Discharge Summary (Addendum)
Physician Discharge Summary    Amber Zamora  MR#: [id number]  DOB:[birth year]  Date of Admission: 01/24/2013 Date of Discharge: 01/27/2013  Attending Physician:Adasia Hoar, Lorin Picket  Patient's PCP: Dr Ardyth Harps   Discharge Diagnoses: A fib w/ RVR, HCAP, UTI, 5mm intracranial hemorrhage   Discharge Medications:   Medication List    STOP taking these medications       dipyridamole-aspirin 200-25 MG per 12 hr capsule  Commonly known as:  AGGRENOX      TAKE these medications       albuterol (2.5 MG/3ML) 0.083% nebulizer solution  Commonly known as:  PROVENTIL  Take 2.5 mg by nebulization every 6 (six) hours as needed for wheezing.     amLODipine 5 MG tablet  Commonly known as:  NORVASC  Take 5 mg by mouth daily.     atorvastatin 20 MG tablet  Commonly known as:  LIPITOR  Take 20 mg by mouth daily.     calcium-vitamin D 500-200 MG-UNIT per tablet  Commonly known as:  OSCAL WITH D  Take 1 tablet by mouth 3 (three) times daily.     diltiazem 60 MG tablet  Commonly known as:  CARDIZEM  Take 1 tablet (60 mg total) by mouth every 6 (six) hours.     fluticasone 50 MCG/ACT nasal spray  Commonly known as:  FLONASE  Place 2 sprays into the nose daily.     levofloxacin 750 MG/150ML Soln  Commonly known as:  LEVAQUIN  1 tab by mouth on 7/28 and 7/30 then stop     levothyroxine 50 MCG tablet  Commonly known as:  SYNTHROID, LEVOTHROID  Take 50 mcg by mouth daily before breakfast.     loratadine 10 MG tablet  Commonly known as:  CLARITIN  Take 10 mg by mouth daily.     sertraline 50 MG tablet  Commonly known as:  ZOLOFT  Take 50 mg by mouth daily.     SYSTANE PRESERVATIVE FREE 0.4-0.3 % Soln  Generic drug:  Polyethyl Glycol-Propyl Glycol  Place 1 drop into both eyes 2 (two) times daily.     Travoprost (BAK Free) 0.004 % Soln ophthalmic solution  Commonly known as:  TRAVATAN  Place 1 drop into both eyes at bedtime.     Vitamin D (Ergocalciferol) 50000 UNITS Caps   Commonly known as:  DRISDOL  Take 50,000 Units by mouth every 7 (seven) days.        Hospital Procedures: Ct Head Wo Contrast  01/24/2013   *RADIOLOGY REPORT*  Clinical Data: Status post fall with right eye bleed  CT HEAD WITHOUT CONTRAST  Technique:  Contiguous axial images were obtained from the base of the skull through the vertex without contrast.  Comparison: Nov 15, 2005  Findings: There is a 5 mm focus of increased density in the left frontal periventricular white matter on image 20 suspicious for acute hemorrhage.  There is chronic diffuse atrophy.  Chronic bilateral periventricular matter small vessel ischemic change is identified.  There is evidence of old infarct in the left sub insula. There is a 2 mm focus of focal calcification in the extra- axial space left frontal region on image 15 unchanged.  There is minimal deformity of the lateral aspect of the right nasal bone. Evaluation of the bones are limited by patient motion.  IMPRESSION: A 5 mm focus of increased density in the left frontal periventricular white matter suspicious for a small focus of acute hemorrhage.  Minimal deformity at the lateral aspect of  the right nasal bone, evaluation of the bones are limited by patient motion. Correlate with maxillofacial CT of today.  Stable chronic changes.   Original Report Authenticated By: Sherian Rein, M.D.   Ct Cervical Spine Wo Contrast  01/24/2013   *RADIOLOGY REPORT*  Clinical Data: Status post fall with right eye bleed  CT CERVICAL SPINE WITHOUT CONTRAST,CT MAXILLOFACIAL WITHOUT CONTRAST  Technique:  Multidetector CT imaging of the maxillofacial structures and cervical spine were performed following the standard protocol without intravenous contrast.  Multiplanar CT image reconstructions of the maxillofacial structures and cervical spine were also generated.  Cervical spine:  Findings: There is no acute fracture or dislocation.  There are degenerative joint changes of cervical spine with  narrowed joint space and osteophyte formation as well as facet joint sclerosis. The prevertebral soft tissue is normal.  There is right pleural effusion with probable superimposed pleural thickening.  IMPRESSION: No acute fracture or dislocation of cervical spine.  Degenerative joint changes of cervical spine.  Maxillofacial:  Findings:  There is fracture right nasal bone with mild deformity of the right nasal bone, new compared with prior head CT of May 2007. No other acute displaced fracture or dislocation is identified.  The sinuses are clear.  The orbits are normal.  There is mild soft tissue swelling over right eyelid.  Impression:  Mild acute fracture of the right nasal bone as described.  Mild soft tissue swelling over right eyelid.   Original Report Authenticated By: Sherian Rein, M.D.   Dg Chest Port 1 View  01/24/2013   *RADIOLOGY REPORT*  Clinical Data: Hypertension, status post fall.  PORTABLE CHEST - 1 VIEW  Comparison: Nov 12, 2005  Findings: There is patchy opacity in the left lung base.  There is no pulmonary edema or pleural effusion.  The heart size is enlarged.  The aorta is tortuous.  The soft tissues and osseous structures are stable.  IMPRESSION: Patchy consolidation of left lung base, could represent pneumonia.   Original Report Authenticated By: Sherian Rein, M.D.   Ct Maxillofacial Wo Cm  01/24/2013   *RADIOLOGY REPORT*  Clinical Data: Status post fall with right eye bleed  CT CERVICAL SPINE WITHOUT CONTRAST,CT MAXILLOFACIAL WITHOUT CONTRAST  Technique:  Multidetector CT imaging of the maxillofacial structures and cervical spine were performed following the standard protocol without intravenous contrast.  Multiplanar CT image reconstructions of the maxillofacial structures and cervical spine were also generated.  Cervical spine:  Findings: There is no acute fracture or dislocation.  There are degenerative joint changes of cervical spine with narrowed joint space and osteophyte  formation as well as facet joint sclerosis. The prevertebral soft tissue is normal.  There is right pleural effusion with probable superimposed pleural thickening.  IMPRESSION: No acute fracture or dislocation of cervical spine.  Degenerative joint changes of cervical spine.  Maxillofacial:  Findings:  There is fracture right nasal bone with mild deformity of the right nasal bone, new compared with prior head CT of May 2007. No other acute displaced fracture or dislocation is identified.  The sinuses are clear.  The orbits are normal.  There is mild soft tissue swelling over right eyelid.  Impression:  Mild acute fracture of the right nasal bone as described.  Mild soft tissue swelling over right eyelid.   Original Report Authenticated By: Sherian Rein, M.D.    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 At baseline can answer yes and no questions, but otherwise moans and is nonverbal. At baseline in a wheelchair, possibly self propelling, today while in the hallway, patient fell from the wheelchair with purposeful movement, striking the right side against the ground without loss of consciousness. Patient did sustain a laceration to the nasal bridge as well as right ocular area was significant bleeding. Patient was wearing glasses. No change from patient's baseline mental status at the time however given prolonged bleeding, patient was transported to the emergency room for further evaluation and management.  In emergency room the patient was noted to be in atrial fibrillation with rapid ventricular response, was hemodynamically stable help her heart rate varied from the upper 80s to 180. She was normotensive, and in no respiratory distress, oxygen levels normal on  room air. Patient remained nonverbal except for laughing. Head CT also found a 5 mm area of hemorrhage, neurosurgery consult to by ER physician, no indications for further treatment or management based on current condition. Patient is now being admitted for her atrial fibrillation with rapid ventricular response, questionable pneumonia, however per daughter who is present, patient has a baseline chest x-ray with abnormalities and has been told that she has chronic pneumonia. Patient apparently does have a history of coronary artery disease with some sort of obstruction in the LAD per daughter's report but this dates back 20 years ago. She's not had any cardiovascular issues since then. Patient is DO NOT RESUSCITATE/DO NOT INTUBATE, confirmed by the patient's daughter he was a legal guardian. After discussion, the patient's daughter wishes conservative measures only. In emergency room the patient as are Rady started on Zosyn and vancomycin given her residency status in a skilled nursing facility for presumed pneumonia as well as a diltiazem drip given her RVR.      Hospital Course: A fib w/ RVR - appeared to be new onset. TSH, TTE unremarkable during this admission, cause may be from chronic aspiration? HR 50-100 on dilt PO 60mg  q6hrs (continuing q6hrs b/c pills must be crushed and XR dosing cannot be crushed). Holding on anticoagulation for the near future given high fall risk (she presented w/ fall and nasal fracture and 5mm frontal intracerebral hemorrhage.) PCP can determine as outpt if they feel need to resume anticoagulation. Daughter is in understanding and agreement w/ this plan   LLL HCAP - intially treated w/ vanc/zosyn on admission but quickly deescalated to levaquin 750mg  q48hours to complete course of 7 days (next dose due 7/28, last dose 7/30). She was clinically stable w/o fever/leukocytosis throughout hospital stay.   UTI - based on U/A. Culture pending, but no growth in 48 hours. Treating  w/ levaquin as above.   Remote CVA - Holding aggrenox for the near future given she has 5mm intracerebral hemorrhage. PCP can resume in future if he feels warranted.   Acute Intracranial hemorrhage - 5mm in size in the frontal lobe. Given patient goals of comfort w/o major intervention, NSG signed off with no new plans of action.  HTN - continued home meds and added diltiazem as above   R ocular bleed/chronic conjunctivitis on R  - remained stable after admission w/ no need for urgent inpatient optho consult. They follow closely w/ Dr Elmer Picker as outpt.   R nasal fracture - manage conservatively. No active pain   Ethics - DNR/DNI w/ comfort as main goal.    Day of  Discharge Exam BP 151/80  Pulse 84  Temp(Src) 97.6 F (36.4 C) (Axillary)  Resp 17  Ht 5\' 1"  (1.549 m)  Wt 160 lb 0.9 oz (72.6 kg)  BMI 30.26 kg/m2  SpO2 93%  Physical Exam: General appearance: WF w/ ecchymosis around R eye  Eyes: no scleral icterus. Dried blood on lid of R eye  Throat: oropharynx moist without erythema  Resp: CTAB, crackles in L base  Cardio: ir/ir, reg rate, no MRG  GI: soft, non-tender; bowel sounds normal; no masses, no organomegaly  Extremities: no clubbing, cyanosis or edema  Discharge Labs:  Recent Labs  01/26/13 0455 01/27/13 0512  NA 140 142  K 3.2* 3.9  CL 105 106  CO2 25 26  GLUCOSE 99 102*  BUN 9 9  CREATININE 0.93 0.97  CALCIUM 9.1 9.2    Recent Labs  01/26/13 0455 01/27/13 0512  AST 16 17  ALT 11 12  ALKPHOS 82 81  BILITOT 0.4 0.4  PROT 5.9* 5.9*  ALBUMIN 2.9* 3.0*    Recent Labs  01/24/13 1946  01/26/13 0455 01/27/13 0512  WBC 7.5  < > 5.8 6.0  NEUTROABS 5.1  --   --   --   HGB 12.3  < > 11.5* 11.8*  HCT 36.9  < > 35.8* 35.5*  MCV 83.9  < > 83.8 83.5  PLT 196  < > 162 183  < > = values in this interval not displayed. No results found for this basename: INR,  PROTIME    Recent Labs  01/25/13 0123 01/25/13 0810 01/25/13 1433  TROPONINI <0.30 <0.30  <0.30    Recent Labs  01/25/13 0810  TSH 2.047   No results found for this basename: VITAMINB12, FOLATE, FERRITIN, TIBC, IRON, RETICCTPCT,  in the last 72 hours  Discharge instructions: Discharge Orders   Future Orders Complete By Expires     Diet - low sodium heart healthy  As directed     Increase activity slowly  As directed      Patient was started on diltiazem q6 hours for rate control w/ a fib. She was stable w/ HR 50-100 x 24 hours prior to discharge. Continue to monitor HR qShift and titrate diltiazem as needed to keep HR in this range.     Disposition: return to [facility]   Follow-up Appts: Follow-up with your primary physician at [facility]   Condition on Discharge: stable   Tests Needing Follow-up: none   Time spent in discharge (includes decision making & examination of pt): 45 minutes    Signed: Taariq Leitz 01/27/2013, 7:07 AM

## 2013-01-27 NOTE — Clinical Social Work Psychosocial (Signed)
Clinical Social Work Department BRIEF PSYCHOSOCIAL ASSESSMENT 01/27/2013  Patient:  Amber Zamora, Amber Zamora     Account Number:  [id number]     Admit date:  01/24/2013  Clinical Social Worker:  Hendricks Milo  Date/Time:  01/27/2013 12:00 M  Referred by:  Physician  Date Referred:  01/26/2013 Referred for  SNF Placement   Other Referral:   Interview type:  Family Other interview type:   Amber Zamora (daughter of pt) [phone]    PSYCHOSOCIAL DATA Living Status:  FACILITY Admitted from facility:  [facility] Level of care:  Skilled Nursing Facility Primary support name:  Jenya Putz Primary support relationship to patient:  CHILD, ADULT Degree of support available:   Daughter was at bedside.    CURRENT CONCERNS Current Concerns  Post-Acute Placement   Other Concerns:    SOCIAL WORK ASSESSMENT / PLAN CSW informed Corinne Goucher (daughter of pt) that transport has been set up and the pt will return to [facility] on today.   Assessment/plan status:   Other assessment/ plan:   Information/referral to community resources:    PATIENT'S/FAMILY'S RESPONSE TO PLAN OF CARE: Amber Zamora (daughter of pt) was appreciative of the services provided by social work during her mother's admission.

## 2013-01-27 NOTE — Progress Notes (Signed)
Pt  Family at bedside waiting for transportation to SNF IV out

## 2013-01-27 NOTE — Clinical Social Work Note (Signed)
Patient medically stable for discharge back to [facility] today. Discharge summary forwarded to facility and medical pack compiled. CSW facilitated transport to skilled nursing facility via ambulance.   Genelle Bal, MSW, LCSW [phone]

## 2013-01-27 NOTE — Clinical Social Work Note (Signed)
Pt is medically stable for discharge back to [facility] today. Discharge packet complied and daughter Alyze Lauf was informed of discharge and ambulance transport. CSW facilitated transport via ambulance.  Jetta Lout, LCSW Weekend CSW

## 2013-01-30 LAB — URINE CULTURE

## 2014-01-06 ENCOUNTER — Emergency Department (HOSPITAL_COMMUNITY)
Admission: EM | Admit: 2014-01-06 | Discharge: 2014-01-06 | Disposition: A | Discharge status: Home / Self Care | Payer: Medicare Other | Attending: Endocrinology | Admitting: Endocrinology

## 2014-01-06 ENCOUNTER — Encounter (HOSPITAL_COMMUNITY): Payer: Self-pay | Admitting: Emergency Medicine

## 2014-01-06 ENCOUNTER — Emergency Department (HOSPITAL_COMMUNITY): Payer: Medicare Other

## 2014-01-06 DIAGNOSIS — F3289 Other specified depressive episodes: Secondary | ICD-10-CM | POA: Diagnosis not present

## 2014-01-06 DIAGNOSIS — Z8739 Personal history of other diseases of the musculoskeletal system and connective tissue: Secondary | ICD-10-CM | POA: Diagnosis not present

## 2014-01-06 DIAGNOSIS — I4891 Unspecified atrial fibrillation: Secondary | ICD-10-CM | POA: Insufficient documentation

## 2014-01-06 DIAGNOSIS — Z8701 Personal history of pneumonia (recurrent): Secondary | ICD-10-CM | POA: Diagnosis not present

## 2014-01-06 DIAGNOSIS — Z8673 Personal history of transient ischemic attack (TIA), and cerebral infarction without residual deficits: Secondary | ICD-10-CM | POA: Diagnosis not present

## 2014-01-06 DIAGNOSIS — F329 Major depressive disorder, single episode, unspecified: Secondary | ICD-10-CM | POA: Diagnosis not present

## 2014-01-06 DIAGNOSIS — IMO0002 Reserved for concepts with insufficient information to code with codable children: Secondary | ICD-10-CM | POA: Insufficient documentation

## 2014-01-06 DIAGNOSIS — N390 Urinary tract infection, site not specified: Secondary | ICD-10-CM | POA: Insufficient documentation

## 2014-01-06 DIAGNOSIS — R000 Tachycardia, unspecified: Secondary | ICD-10-CM | POA: Diagnosis not present

## 2014-01-06 DIAGNOSIS — R0602 Shortness of breath: Secondary | ICD-10-CM | POA: Diagnosis present

## 2014-01-06 DIAGNOSIS — Z79899 Other long term (current) drug therapy: Secondary | ICD-10-CM | POA: Insufficient documentation

## 2014-01-06 DIAGNOSIS — H409 Unspecified glaucoma: Secondary | ICD-10-CM | POA: Diagnosis not present

## 2014-01-06 DIAGNOSIS — F0390 Unspecified dementia without behavioral disturbance: Secondary | ICD-10-CM | POA: Insufficient documentation

## 2014-01-06 DIAGNOSIS — G529 Cranial nerve disorder, unspecified: Secondary | ICD-10-CM | POA: Diagnosis not present

## 2014-01-06 DIAGNOSIS — I10 Essential (primary) hypertension: Secondary | ICD-10-CM | POA: Insufficient documentation

## 2014-01-06 DIAGNOSIS — I509 Heart failure, unspecified: Secondary | ICD-10-CM | POA: Insufficient documentation

## 2014-01-06 HISTORY — DX: Unspecified atrial fibrillation: I48.91

## 2014-01-06 LAB — CBC WITH DIFFERENTIAL/PLATELET
Basophils Absolute: 0 10*3/uL (ref 0.0–0.1)
Basophils Relative: 0 % (ref 0–1)
EOS ABS: 0 10*3/uL (ref 0.0–0.7)
EOS PCT: 0 % (ref 0–5)
HCT: 37.3 % (ref 36.0–46.0)
HEMOGLOBIN: 11.9 g/dL — AB (ref 12.0–15.0)
LYMPHS ABS: 0.8 10*3/uL (ref 0.7–4.0)
Lymphocytes Relative: 8 % — ABNORMAL LOW (ref 12–46)
MCH: 28.5 pg (ref 26.0–34.0)
MCHC: 31.9 g/dL (ref 30.0–36.0)
MCV: 89.2 fL (ref 78.0–100.0)
MONOS PCT: 5 % (ref 3–12)
Monocytes Absolute: 0.4 10*3/uL (ref 0.1–1.0)
Neutro Abs: 8.5 10*3/uL — ABNORMAL HIGH (ref 1.7–7.7)
Neutrophils Relative %: 87 % — ABNORMAL HIGH (ref 43–77)
Platelets: 143 10*3/uL — ABNORMAL LOW (ref 150–400)
RBC: 4.18 MIL/uL (ref 3.87–5.11)
RDW: 15.3 % (ref 11.5–15.5)
WBC: 9.8 10*3/uL (ref 4.0–10.5)

## 2014-01-06 LAB — URINALYSIS, ROUTINE W REFLEX MICROSCOPIC
Bilirubin Urine: NEGATIVE
Glucose, UA: NEGATIVE mg/dL
KETONES UR: 15 mg/dL — AB
NITRITE: POSITIVE — AB
PH: 7 (ref 5.0–8.0)
Protein, ur: >300 mg/dL — AB
SPECIFIC GRAVITY, URINE: 1.02 (ref 1.005–1.030)
Urobilinogen, UA: 0.2 mg/dL (ref 0.0–1.0)

## 2014-01-06 LAB — I-STAT TROPONIN, ED: Troponin i, poc: 0.02 ng/mL (ref 0.00–0.08)

## 2014-01-06 LAB — TROPONIN I: Troponin I: <0.3 ng/mL (ref <0.30)

## 2014-01-06 LAB — I-STAT CG4 LACTIC ACID, ED: Lactic Acid, Venous: 1.5 mmol/L (ref 0.5–2.2)

## 2014-01-06 LAB — BASIC METABOLIC PANEL
Anion gap: 13 (ref 5–15)
BUN: 23 mg/dL (ref 6–23)
CHLORIDE: 108 meq/L (ref 96–112)
CO2: 24 mEq/L (ref 19–32)
CREATININE: 0.93 mg/dL (ref 0.50–1.10)
Calcium: 9.3 mg/dL (ref 8.4–10.5)
GFR calc Af Amer: 59 mL/min — ABNORMAL LOW (ref >90)
GFR, EST NON AFRICAN AMERICAN: 51 mL/min — AB (ref >90)
GLUCOSE: 152 mg/dL — AB (ref 70–99)
POTASSIUM: 4.3 meq/L (ref 3.7–5.3)
Sodium: 145 mEq/L (ref 137–147)

## 2014-01-06 LAB — URINE MICROSCOPIC-ADD ON

## 2014-01-06 LAB — PRO B NATRIURETIC PEPTIDE: Pro B Natriuretic peptide (BNP): 2988 pg/mL — ABNORMAL HIGH (ref 0–450)

## 2014-01-06 MED ORDER — DILTIAZEM LOAD VIA INFUSION
10.0000 mg | Freq: Once | INTRAVENOUS | Status: AC
  Administered 2014-01-06: 10 mg via INTRAVENOUS
  Filled 2014-01-06: qty 10

## 2014-01-06 MED ORDER — DILTIAZEM HCL 100 MG IV SOLR
5.0000 mg/h | INTRAVENOUS | Status: DC
  Administered 2014-01-06: 5 mg/h via INTRAVENOUS

## 2014-01-06 MED ORDER — PIPERACILLIN-TAZOBACTAM 3.375 G IVPB 30 MIN
3.3750 g | Freq: Once | INTRAVENOUS | Status: AC
  Administered 2014-01-06: 3.375 g via INTRAVENOUS
  Filled 2014-01-06: qty 50

## 2014-01-06 MED ORDER — IPRATROPIUM-ALBUTEROL 0.5-2.5 (3) MG/3ML IN SOLN: 3.0000 mL | RESPIRATORY_TRACT | Status: DC

## 2014-01-06 MED ORDER — IPRATROPIUM-ALBUTEROL 0.5-2.5 (3) MG/3ML IN SOLN
3.0000 mL | Freq: Once | RESPIRATORY_TRACT | Status: AC
  Administered 2014-01-06: 3 mL via RESPIRATORY_TRACT
  Filled 2014-01-06: qty 3

## 2014-01-06 MED ORDER — SODIUM CHLORIDE 0.9 % IV BOLUS (SEPSIS)
500.0000 mL | Freq: Once | INTRAVENOUS | Status: AC
  Administered 2014-01-06: 500 mL via INTRAVENOUS

## 2014-01-06 NOTE — ED Notes (Signed)
Portable xray at bedside.

## 2014-01-06 NOTE — ED Notes (Signed)
Pts daughter at bedside  

## 2014-01-06 NOTE — ED Notes (Signed)
Called portable xray to see what the delay is.

## 2014-01-06 NOTE — ED Notes (Signed)
[facility] at bedside to transport pt back. All paperwork given to them.

## 2014-01-06 NOTE — ED Notes (Signed)
Phlebotomy at bedside.

## 2014-01-06 NOTE — ED Notes (Signed)
Family at bedside. 

## 2014-01-06 NOTE — Consult Note (Signed)
Reason for Consult:resp failure Referring Physician: Reynold Bowen  Amber Zamora is an 80 y.o. female.  HPI: Amber Zamora is a 80 yo female who has lived at [facility] for 8 yrs.  She was last admitted one year ago. Today she devoloped acute respiratory failure.  Pulse was 170, she was frothing at the mouth.   She was sent to the ER for eval.  In the ER she was found to be in afib with rvr, pulm edema and she may have pna.  She was placed on dilt gtt, bipap and abx.  On further discussion with her daughter at the bedside it is decided that Amber Zamora is at the end of her life and she would not want further acute care admissions to the hospital. Therefore we will arrange for transfer back to the nursing facility for end of life, comfort care.  Past Medical History  Diagnosis Date  . Aphasia   . Dysphagia   . Facial weakness   . Stroke   . Depression   . Hypertension   . Glaucoma   . Right foot drop   . Atrial fibrillation     History reviewed. No pertinent past surgical history.  No family history on file.  Social History:  reports that she has never smoked. She does not have any smokeless tobacco history on file. She reports that she does not drink alcohol or use illicit drugs.  Allergies: No Known Allergies  Medications: per NH records  Results for orders placed during the hospital encounter of 01/06/14 (from the past 48 hour(s))  BASIC METABOLIC PANEL     Status: Abnormal   Collection Time    01/06/14  7:59 AM      Result Value Ref Range   Sodium 145  137 - 147 mEq/L   Potassium 4.3  3.7 - 5.3 mEq/L   Chloride 108  96 - 112 mEq/L   CO2 24  19 - 32 mEq/L   Glucose, Bld 152 (*) 70 - 99 mg/dL   BUN 23  6 - 23 mg/dL   Creatinine, Ser 0.93  0.50 - 1.10 mg/dL   Calcium 9.3  8.4 - 10.5 mg/dL   GFR calc non Af Amer 51 (*) >90 mL/min   GFR calc Af Amer 59 (*) >90 mL/min   Comment: (NOTE)     The eGFR has been calculated using the CKD EPI equation.     This calculation has not  been validated in all clinical situations.     eGFR's persistently <90 mL/min signify possible Chronic Kidney     Disease.   Anion gap 13  5 - 15  CBC WITH DIFFERENTIAL     Status: Abnormal   Collection Time    01/06/14  7:59 AM      Result Value Ref Range   WBC 9.8  4.0 - 10.5 K/uL   RBC 4.18  3.87 - 5.11 MIL/uL   Hemoglobin 11.9 (*) 12.0 - 15.0 g/dL   HCT 37.3  36.0 - 46.0 %   MCV 89.2  78.0 - 100.0 fL   MCH 28.5  26.0 - 34.0 pg   MCHC 31.9  30.0 - 36.0 g/dL   RDW 15.3  11.5 - 15.5 %   Platelets 143 (*) 150 - 400 K/uL   Neutrophils Relative % 87 (*) 43 - 77 %   Neutro Abs 8.5 (*) 1.7 - 7.7 K/uL   Lymphocytes Relative 8 (*) 12 - 46 %   Lymphs Abs  0.8  0.7 - 4.0 K/uL   Monocytes Relative 5  3 - 12 %   Monocytes Absolute 0.4  0.1 - 1.0 K/uL   Eosinophils Relative 0  0 - 5 %   Eosinophils Absolute 0.0  0.0 - 0.7 K/uL   Basophils Relative 0  0 - 1 %   Basophils Absolute 0.0  0.0 - 0.1 K/uL  PRO B NATRIURETIC PEPTIDE     Status: Abnormal   Collection Time    01/06/14  7:59 AM      Result Value Ref Range   Pro B Natriuretic peptide (BNP) 2988.0 (*) 0 - 450 pg/mL  TROPONIN I     Status: None   Collection Time    01/06/14  7:59 AM      Result Value Ref Range   Troponin I <0.30  <0.30 ng/mL   Comment:            Due to the release kinetics of cTnI,     a negative result within the first hours     of the onset of symptoms does not rule out     myocardial infarction with certainty.     If myocardial infarction is still suspected,     repeat the test at appropriate intervals.  Amber Zamora, ED     Status: None   Collection Time    01/06/14  8:05 AM      Result Value Ref Range   Troponin i, poc 0.02  0.00 - 0.08 ng/mL   Comment 3            Comment: Due to the release kinetics of cTnI,     a negative result within the first hours     of the onset of symptoms does not rule out     myocardial infarction with certainty.     If myocardial infarction is still suspected,      repeat the test at appropriate intervals.  I-STAT CG4 LACTIC ACID, ED     Status: None   Collection Time    01/06/14  8:07 AM      Result Value Ref Range   Lactic Acid, Venous 1.50  0.5 - 2.2 mmol/L  URINALYSIS, ROUTINE W REFLEX MICROSCOPIC     Status: Abnormal   Collection Time    01/06/14  8:14 AM      Result Value Ref Range   Color, Urine YELLOW  YELLOW   APPearance CLOUDY (*) CLEAR   Specific Gravity, Urine 1.020  1.005 - 1.030   pH 7.0  5.0 - 8.0   Glucose, UA NEGATIVE  NEGATIVE mg/dL   Hgb urine dipstick MODERATE (*) NEGATIVE   Bilirubin Urine NEGATIVE  NEGATIVE   Ketones, ur 15 (*) NEGATIVE mg/dL   Protein, ur >594 (*) NEGATIVE mg/dL   Urobilinogen, UA 0.2  0.0 - 1.0 mg/dL   Nitrite POSITIVE (*) NEGATIVE   Leukocytes, UA MODERATE (*) NEGATIVE  URINE MICROSCOPIC-ADD ON     Status: Abnormal   Collection Time    01/06/14  8:14 AM      Result Value Ref Range   WBC, UA 11-20  <3 WBC/hpf   RBC / HPF 3-6  <3 RBC/hpf   Bacteria, UA MANY (*) RARE   Crystals TRIPLE PHOSPHATE CRYSTALS (*) NEGATIVE    Ct Head Wo Contrast  01/06/2014   CLINICAL DATA:  Altered mental status  EXAM: CT HEAD WITHOUT CONTRAST  TECHNIQUE: Contiguous axial images were obtained from the base  of the skull through the vertex without intravenous contrast.  COMPARISON:  01/24/2013  FINDINGS: Bony calvarium is intact. Atrophic and chronic white matter ischemic changes are again seen and stable. No acute hemorrhage, acute infarction or space-occupying mass lesion is noted.  IMPRESSION: Chronic changes without acute abnormality.   Electronically Signed   By: Inez Catalina M.D.   On: 01/06/2014 09:09   Dg Chest Port 1 View  01/06/2014   CLINICAL DATA:  Shortness of breath  EXAM: PORTABLE CHEST - 1 VIEW  COMPARISON:  Portable chest x-ray of January 24, 2013  FINDINGS: The patient is rotated toward the left. The left hemidiaphragm is chronically obscured. There is increased density along the left lateral chest wall consistent  with layering pleural fluid. The right lung is well-expanded. There is infrahilar increased density however. The cardiac silhouette is mildly enlarged though stable. The pulmonary vascularity is not clearly engorged. The trachea is midline. There postsurgical changes of the right shoulder.  IMPRESSION: The findings are consistent with congestive heart failure with mild interstitial edema and moderate size left pleural effusion.   Electronically Signed   By: David  Martinique   On: 01/06/2014 08:44    ROS:her basic level of functioning is wheeling around in the [facility] at the nh. She is aphasic. Some dementia  Blood pressure 95/73, pulse 78, temperature 99.8 F (37.7 C), temperature source Rectal, resp. rate 14, height 5' (1.524 m), weight 68.04 kg (150 lb), SpO2 97.00%.  PHYSICAL EXAM: she is semi-supine not responsive.   Decreased bs bilaterally,  Heart is irreg and tachy, abd soft , not tender. No c/c/e.  Extremities are cool.  Assessment/Plan: 80 yo female with aphasia from past stroke presenting with acute respiratory failure , afib with rvr and possible pna. She is dnr. We will not admit to the hospital.  We will discharge back to her nursing facility for comfort care. The daughter is sure this what Ashlan's wishes would be at this point.  [place] A 01/06/2014, 10:47 AM

## 2014-01-06 NOTE — ED Notes (Signed)
Pt transported to CT with RN and RT

## 2014-01-06 NOTE — ED Notes (Signed)
Per EMS - pt coming from [facility], staff found pt foaming at the home called ems. Upon arrival pt was diaphoretic, BP 160/90. Pt has rales in all lobes. Showing a.fib on monitor 140s-160s. ems placed pt on cpap, O2 sat at 96%. Hx of CHF, a.fib, confusion and CVA. After placing on CPAP BP dropped to 130/80. HR 120s.

## 2014-01-06 NOTE — ED Notes (Signed)
Attempted to gain 2nd PIV. Unsuccessful. Will have another RN attempt.

## 2014-01-06 NOTE — ED Provider Notes (Signed)
CSN: [id number]     Arrival date & time 01/06/14  0728 History   First MD Initiated Contact with Patient 01/06/14 [phone]     Chief Complaint  Patient presents with  . Shortness of Breath     (Consider location/radiation/quality/duration/timing/severity/associated sxs/prior Treatment) HPI Comments: 80 year old female with history of aphasia, stroke, depression, high blood pressure , pneumonia presents with acute shortness of breath and foaming at the mouth since approximately 6 AM today. Patient is at baseline mentally with dementia and stroke history, patient normally moans in response of had not. Family is now here to provide additional history and felt that yesterday patient is at baseline and this all started this morning. Patient has a history of CHF, pneumonia and atrial fibrillation and not on blood thinners due to head bleed. No witnessed head injuries. Patient cannot provide history 2 to baseline confusion and stroke history. Patient lives at the nursing home chronically.  Patient is a 80 y.o. female presenting with shortness of breath. The history is provided by the patient.  Shortness of Breath Severity:  Moderate   Past Medical History  Diagnosis Date  . Aphasia   . Dysphagia   . Facial weakness   . Stroke   . Depression   . Hypertension   . Glaucoma   . Right foot drop   . Atrial fibrillation    History reviewed. No pertinent past surgical history. No family history on file. History  Substance Use Topics  . Smoking status: Never Smoker   . Smokeless tobacco: Not on file  . Alcohol Use: No   OB History   Grav Para Term Preterm Abortions TAB SAB Ect Mult Living                 Review of Systems  Unable to perform ROS: Dementia  Respiratory: Positive for shortness of breath.       Allergies  Review of patient's allergies indicates no known allergies.  Home Medications   Prior to Admission medications   Medication Sig Start Date End Date Taking? Authorizing  Provider  albuterol (PROVENTIL) (2.5 MG/3ML) 0.083% nebulizer solution Take 2.5 mg by nebulization every 6 (six) hours as needed for wheezing.   Yes Historical Provider, MD  amLODipine (NORVASC) 5 MG tablet Take 5 mg by mouth daily.   Yes Historical Provider, MD  atorvastatin (LIPITOR) 20 MG tablet Take 20 mg by mouth daily.   Yes Historical Provider, MD  calcium-vitamin D (OSCAL WITH D) 500-200 MG-UNIT per tablet Take 1 tablet by mouth 3 (three) times daily.   Yes Historical Provider, MD  diltiazem (CARDIZEM) 60 MG tablet Take 120 mg by mouth 2 (two) times daily. 01/27/13  Yes Alysia PennaScott Holwerda, MD  fluticasone (FLONASE) 50 MCG/ACT nasal spray Place 2 sprays into the nose daily.   Yes Historical Provider, MD  levothyroxine (SYNTHROID, LEVOTHROID) 50 MCG tablet Take 50 mcg by mouth daily before breakfast.   Yes Historical Provider, MD  Polyethyl Glycol-Propyl Glycol (SYSTANE PRESERVATIVE FREE) 0.4-0.3 % SOLN Place 1 drop into both eyes 2 (two) times daily.   Yes Historical Provider, MD  sertraline (ZOLOFT) 50 MG tablet Take 50 mg by mouth daily.   Yes Historical Provider, MD  Travoprost, BAK Free, (TRAVATAN) 0.004 % SOLN ophthalmic solution Place 1 drop into both eyes at bedtime.   Yes Historical Provider, MD  Vitamin D, Ergocalciferol, (DRISDOL) 50000 UNITS CAPS Take 50,000 Units by mouth every 7 (seven) days.   Yes Historical Provider, MD   BP  109/95  Pulse 79  Temp(Src) 99.8 F (37.7 C) (Rectal)  Resp 25  Ht 5' (1.524 m)  Wt 150 lb (68.04 kg)  BMI 29.30 kg/m2  SpO2 99% Physical Exam  Nursing note and vitals reviewed. Constitutional: She appears well-developed and well-nourished.  HENT:  Head: Normocephalic and atraumatic.  Eyes: Right eye exhibits no discharge. Left eye exhibits no discharge.  Neck: Normal range of motion. Neck supple. No tracheal deviation present.  Cardiovascular: An irregularly irregular rhythm present. Tachycardia present.   Pulmonary/Chest:  Mild rales  bilateral Mild distress  Abdominal: Soft. She exhibits no distension. There is no tenderness. There is no guarding.  Musculoskeletal: She exhibits no edema.  Neurological: She is alert. A cranial nerve deficit is present. GCS eye subscore is 3. GCS verbal subscore is 4. GCS motor subscore is 6.  Pleasant dementia, responds to family speech  Skin: Skin is warm. No rash noted.  Psychiatric: She has a normal mood and affect.    ED Course  Procedures (including critical care time) CRITICAL CARE Performed by: Enid SkeensZAVITZ, Brigham Cobbins M   Total critical care time: 30 min  Critical care time was exclusive of separately billable procedures and treating other patients.  Critical care was necessary to treat or prevent imminent or life-threatening deterioration.  Critical care was time spent personally by me on the following activities: development of treatment plan with patient and/or surrogate as well as nursing, discussions with consultants, evaluation of patient's response to treatment, examination of patient, obtaining history from patient or surrogate, ordering and performing treatments and interventions, ordering and review of laboratory studies, ordering and review of radiographic studies, pulse oximetry and re-evaluation of patient's condition.  Labs Review Labs Reviewed  BASIC METABOLIC PANEL - Abnormal; Notable for the following:    Glucose, Bld 152 (*)    GFR calc non Af Amer 51 (*)    GFR calc Af Amer 59 (*)    All other components within normal limits  CBC WITH DIFFERENTIAL - Abnormal; Notable for the following:    Hemoglobin 11.9 (*)    Platelets 143 (*)    Neutrophils Relative % 87 (*)    Neutro Abs 8.5 (*)    Lymphocytes Relative 8 (*)    All other components within normal limits  PRO B NATRIURETIC PEPTIDE - Abnormal; Notable for the following:    Pro B Natriuretic peptide (BNP) 2988.0 (*)    All other components within normal limits  URINALYSIS, ROUTINE W REFLEX MICROSCOPIC -  Abnormal; Notable for the following:    APPearance CLOUDY (*)    Hgb urine dipstick MODERATE (*)    Ketones, ur 15 (*)    Protein, ur >300 (*)    Nitrite POSITIVE (*)    Leukocytes, UA MODERATE (*)    All other components within normal limits  URINE MICROSCOPIC-ADD ON - Abnormal; Notable for the following:    Bacteria, UA MANY (*)    Crystals TRIPLE PHOSPHATE CRYSTALS (*)    All other components within normal limits  TROPONIN I  I-STAT CG4 LACTIC ACID, ED  I-STAT TROPOININ, ED    Imaging Review Ct Head Wo Contrast  01/06/2014   CLINICAL DATA:  Altered mental status  EXAM: CT HEAD WITHOUT CONTRAST  TECHNIQUE: Contiguous axial images were obtained from the base of the skull through the vertex without intravenous contrast.  COMPARISON:  01/24/2013  FINDINGS: Bony calvarium is intact. Atrophic and chronic white matter ischemic changes are again seen and stable. No acute hemorrhage,  acute infarction or space-occupying mass lesion is noted.  IMPRESSION: Chronic changes without acute abnormality.   Electronically Signed   By: Alcide Clever M.D.   On: 01/06/2014 09:09   Dg Chest Port 1 View  01/06/2014   CLINICAL DATA:  Shortness of breath  EXAM: PORTABLE CHEST - 1 VIEW  COMPARISON:  Portable chest x-ray of January 24, 2013  FINDINGS: The patient is rotated toward the left. The left hemidiaphragm is chronically obscured. There is increased density along the left lateral chest wall consistent with layering pleural fluid. The right lung is well-expanded. There is infrahilar increased density however. The cardiac silhouette is mildly enlarged though stable. The pulmonary vascularity is not clearly engorged. The trachea is midline. There postsurgical changes of the right shoulder.  IMPRESSION: The findings are consistent with congestive heart failure with mild interstitial edema and moderate size left pleural effusion.   Electronically Signed   By: David  [place]   On: 01/06/2014 08:44     EKG  Interpretation   Date/Time:  Sunday January 06 2014 07:48:23 EDT Ventricular Rate:  162 PR Interval:    QRS Duration: 64 QT Interval:  303 QTC Calculation: 497 R Axis:   85 Text Interpretation:  Atrial fibrillation with rapid V-rate Anterior  infarct, old Minimal ST depression, inferior leads Nonspecific T  abnormalities, lateral leads Confirmed by Rhunette Croft, MD, Janey Genta [phone]) on  01/06/2014 7:55:02 AM      MDM   Final diagnoses:  Atrial fibrillation with RVR  UTI (lower urinary tract infection)   Patient presented with respiratory difficulty from the nursing home, clarified her family patient is DO NOT RESUSCITATE no intubation. Patient placed on BiPAP due to respiratory distress and low oxygen saturation is in felt much improved. Heart rate 150s irregular, EKG reviewed atrial fibrillation with RVR. Plan for cardiac, infectious workup. Concern for pneumonia versus CHF. Chest x-ray pending. CT head we done with history of brain bleed and difficulty with detailed neuro exam.  Multiple rechecks and patient improved on BiPAP heart rate improved with titration of drug. Discuss case with family in addition to primary Dr. on call who will evaluate the patient ER. I long discussion with family in terms of future comfort care measures.  The patients results and plan were reviewed and discussed.   Any x-rays performed were personally reviewed by myself.   Differential diagnosis were considered with the presenting HPI.  Medications  diltiazem (CARDIZEM) 1 mg/mL load via infusion 10 mg (0 mg Intravenous Stopped 01/06/14 0855)  sodium chloride 0.9 % bolus 500 mL (0 mLs Intravenous Stopped 01/06/14 0854)  piperacillin-tazobactam (ZOSYN) IVPB 3.375 g (0 g Intravenous Stopped 01/06/14 1027)  ipratropium-albuterol (DUONEB) 0.5-2.5 (3) MG/3ML nebulizer solution 3 mL (3 mLs Nebulization Given 01/06/14 0904)     Filed Vitals:   01/06/14 1015 01/06/14 1030 01/06/14 1045 01/06/14 1100  BP: 94/63 99/56  93/60 106/61  Pulse: 74 54 126 110  Temp:      TempSrc:      Resp: 12 12 15 23   Height:      Weight:      SpO2: 97% 97% 95% 97%    Admission/ observation were discussed with the admitting physician, patient and/or family and they are comfortable with the plan.    As the and an in and  Enid Skeens, MD 01/06/14 [phone]

## 2014-01-06 NOTE — ED Notes (Signed)
Patient IV removed from left wrist, and right ac, 2x2 gauze applied with tape.

## 2014-05-05 DEATH — deceased

## 2015-11-06 IMAGING — CT CT HEAD W/O CM
3 series · 19 of 30 positions shown, 21 images · non-contrast
Comparison: 01/24/2013

CLINICAL DATA: Altered mental status

EXAM:
CT HEAD WITHOUT CONTRAST
TECHNIQUE: Contiguous axial images were obtained from the base of the skull
through the vertex without intravenous contrast.

[Series 201: head w/o, idose (1) · axial · non-contrast · 0.46mm/px · z∈[+301,+426]mm · 6 of 36 slices shown, 8 images (1 of 2)]
[im 6/36  brain]
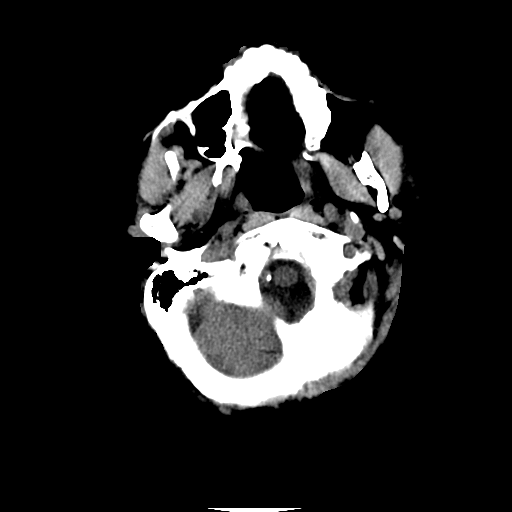
[im 6/36  bone]
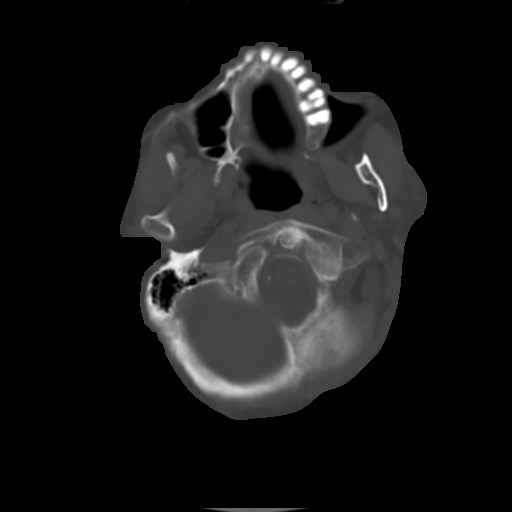
[im 11/36  brain]
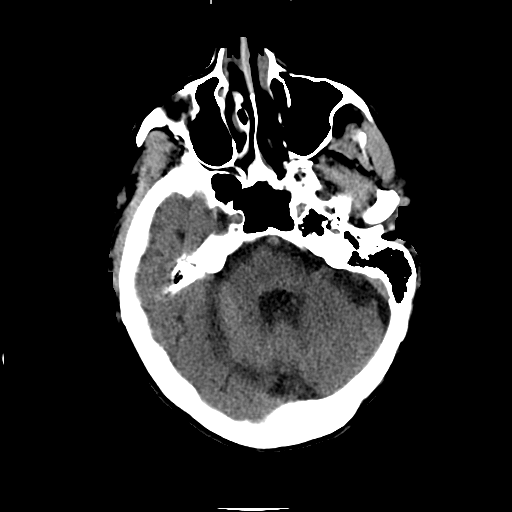
[im 16/36  brain]
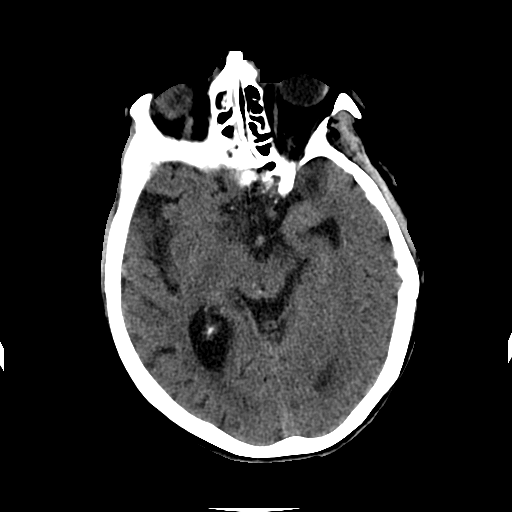
[im 21/36  brain]
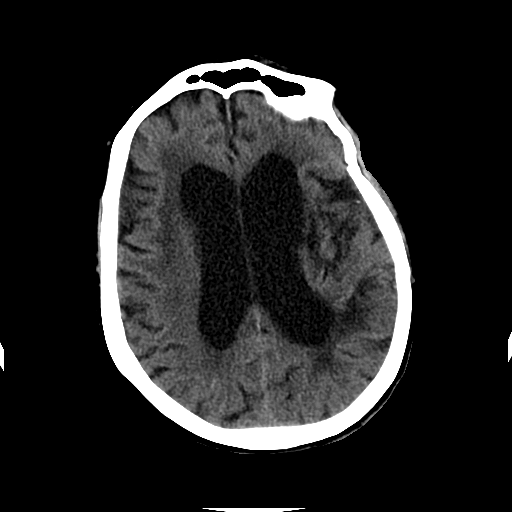
[im 26/36  brain]
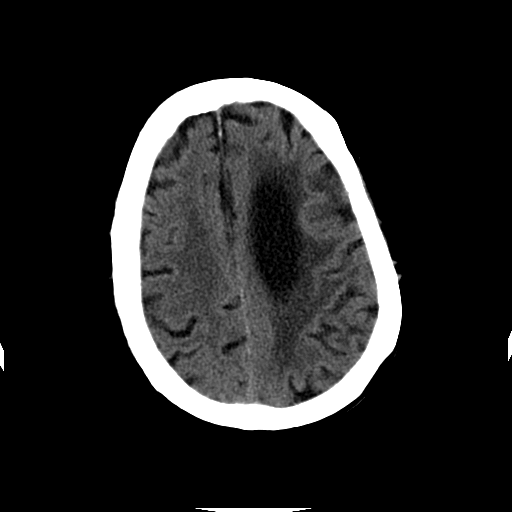
[im 26/36  bone]
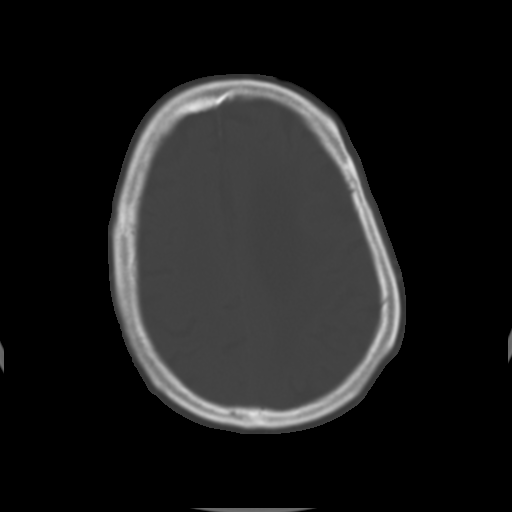
[im 31/36  brain]
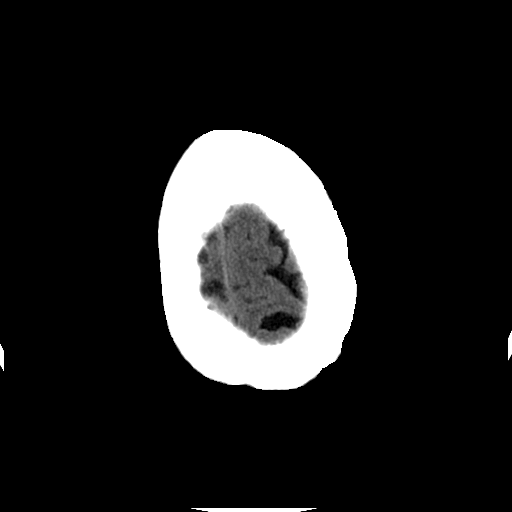

[Series 202: head w/o, idose (1) · axial · non-contrast · 0.53mm/px · z∈[+339,+451]mm · 5 of 35 slices shown (2 of 2)]
[im 6/35  brain]
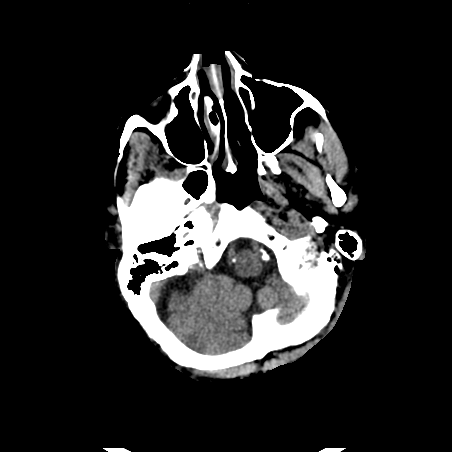
[im 12/35  brain]
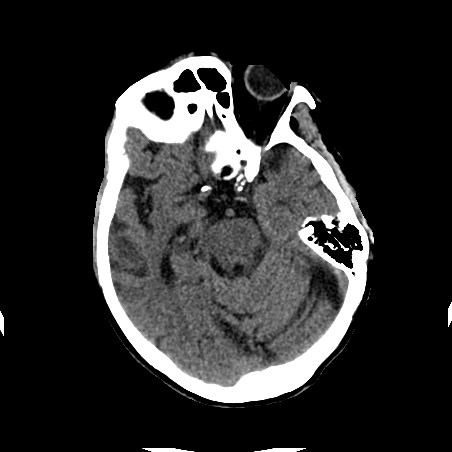
[im 18/35  brain]
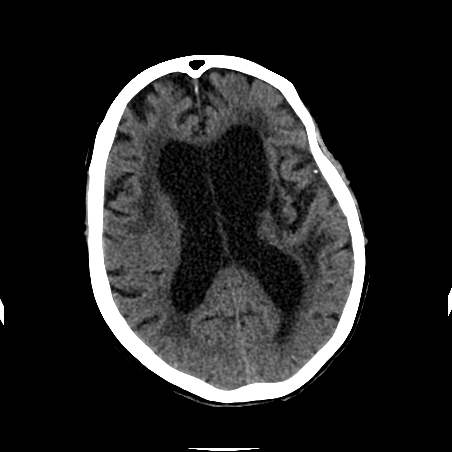
[im 23/35  brain]
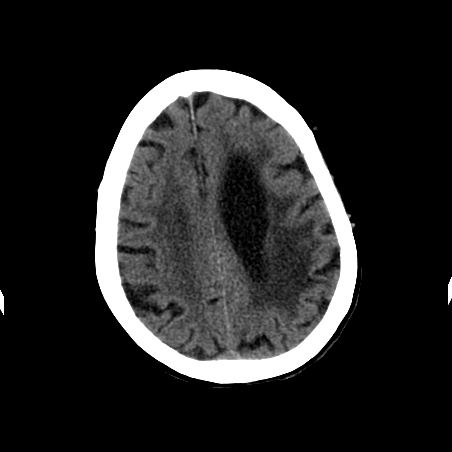
[im 29/35  brain]
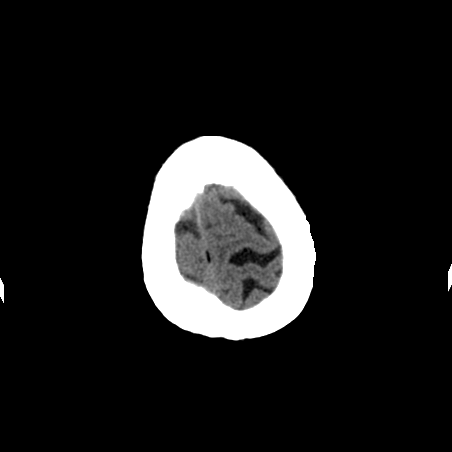

[Series 203: head w/o bone, idose (1) · axial · non-contrast · 0.46mm/px · z∈[+287,+439]mm · 8 of 73 slices shown]
[im 6/73  bone]
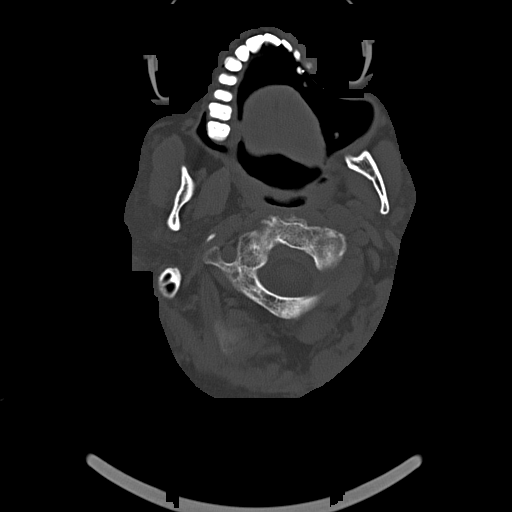
[im 16/73  bone]
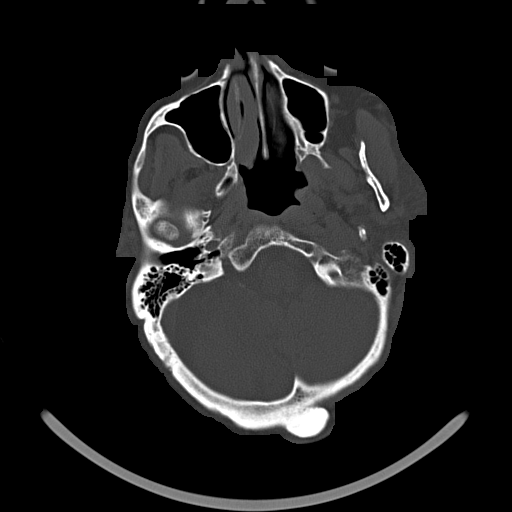
[im 26/73  bone]
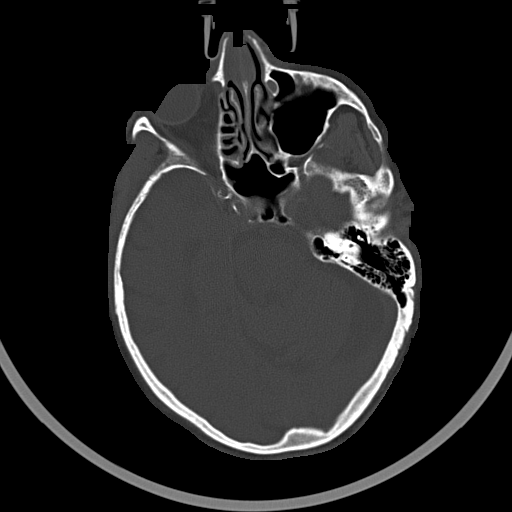
[im 31/73  bone]
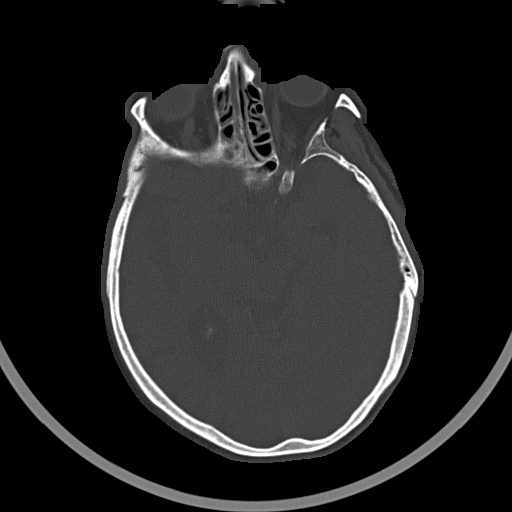
[im 42/73  bone]
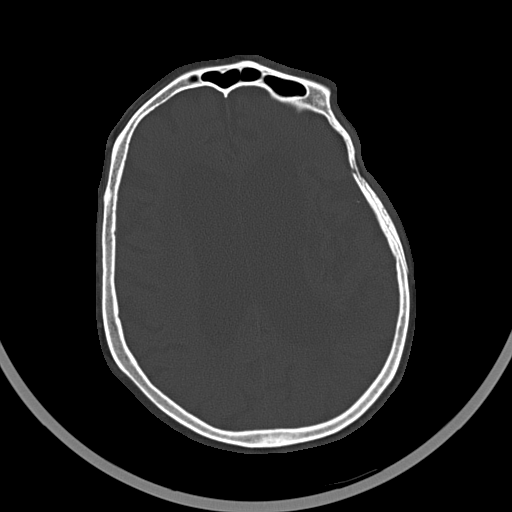
[im 47/73  bone]
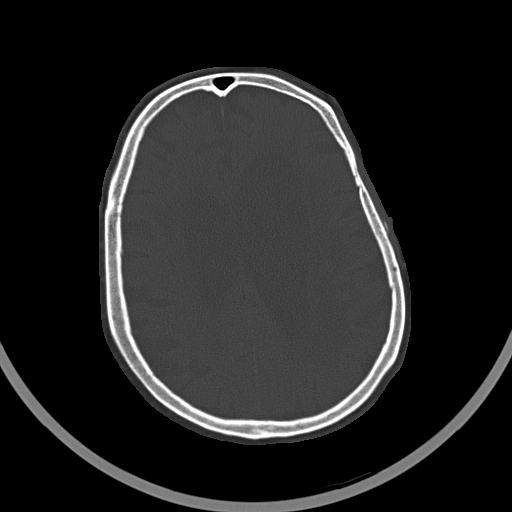
[im 57/73  bone]
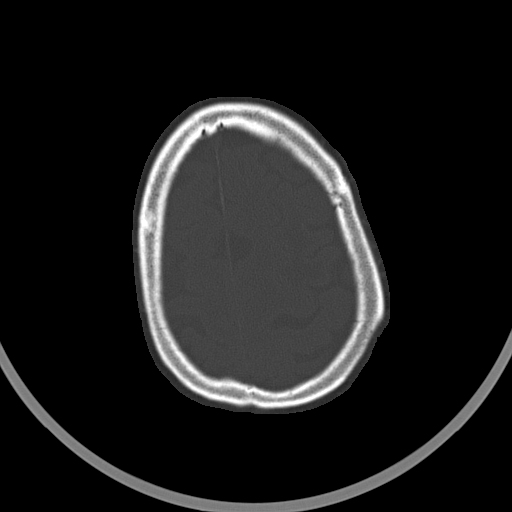
[im 67/73  bone]
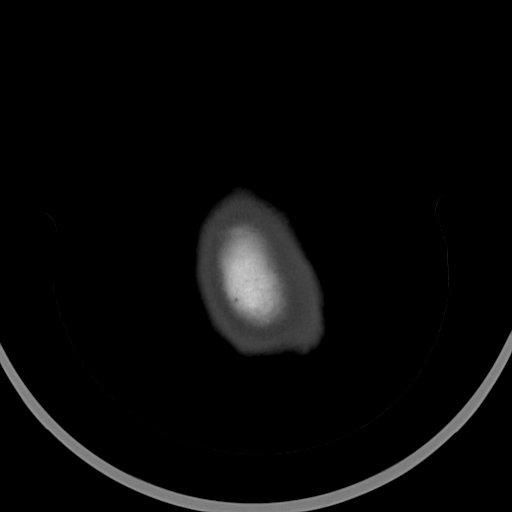

[19 of 30 positions shown; findings below may reference images not displayed]

FINDINGS: Bony calvarium is intact. Atrophic and chronic white matter ischemic
changes are again seen and stable. No acute hemorrhage, acute
infarction or space-occupying mass lesion is noted.
IMPRESSION: Chronic changes without acute abnormality.
